# Patient Record
Sex: Male | Born: 1970 | Race: White | Hispanic: No | Marital: Married | Smoking: Former smoker
Health system: Southern US, Community
[De-identification: ages and names within clinical notes are randomized; demographics above are authoritative.]

## PROBLEM LIST (undated history)

## (undated) DIAGNOSIS — E119 Type 2 diabetes mellitus without complications: Secondary | ICD-10-CM

## (undated) DIAGNOSIS — K219 Gastro-esophageal reflux disease without esophagitis: Secondary | ICD-10-CM

## (undated) DIAGNOSIS — G4730 Sleep apnea, unspecified: Secondary | ICD-10-CM

## (undated) DIAGNOSIS — I10 Essential (primary) hypertension: Secondary | ICD-10-CM

## (undated) DIAGNOSIS — K222 Esophageal obstruction: Secondary | ICD-10-CM

## (undated) DIAGNOSIS — Z889 Allergy status to unspecified drugs, medicaments and biological substances status: Secondary | ICD-10-CM

## (undated) DIAGNOSIS — E669 Obesity, unspecified: Secondary | ICD-10-CM

## (undated) DIAGNOSIS — E7800 Pure hypercholesterolemia, unspecified: Secondary | ICD-10-CM

## (undated) DIAGNOSIS — R079 Chest pain, unspecified: Secondary | ICD-10-CM

## (undated) DIAGNOSIS — R002 Palpitations: Secondary | ICD-10-CM

## (undated) HISTORY — PX: ROTATOR CUFF REPAIR: SHX139

## (undated) HISTORY — DX: Type 2 diabetes mellitus without complications: E11.9

---

## 2003-08-06 ENCOUNTER — Emergency Department (HOSPITAL_COMMUNITY): Admission: EM | Admit: 2003-08-06 | Discharge: 2003-08-06 | Payer: Self-pay | Admitting: Emergency Medicine

## 2010-10-31 ENCOUNTER — Emergency Department (HOSPITAL_COMMUNITY): Payer: BC Managed Care – PPO

## 2010-10-31 ENCOUNTER — Emergency Department (HOSPITAL_COMMUNITY)
Admission: EM | Admit: 2010-10-31 | Discharge: 2010-10-31 | Disposition: A | Discharge status: Home / Self Care | Payer: BC Managed Care – PPO | Attending: Emergency Medicine | Admitting: Emergency Medicine

## 2010-10-31 DIAGNOSIS — I10 Essential (primary) hypertension: Secondary | ICD-10-CM | POA: Insufficient documentation

## 2010-10-31 DIAGNOSIS — I498 Other specified cardiac arrhythmias: Secondary | ICD-10-CM | POA: Insufficient documentation

## 2010-10-31 DIAGNOSIS — R072 Precordial pain: Secondary | ICD-10-CM | POA: Insufficient documentation

## 2010-10-31 LAB — DIFFERENTIAL
Basophils Absolute: 0 10*3/uL (ref 0.0–0.1)
Basophils Relative: 0 % (ref 0–1)
Eosinophils Absolute: 0.2 10*3/uL (ref 0.0–0.7)
Eosinophils Relative: 2 % (ref 0–5)
Lymphocytes Relative: 32 % (ref 12–46)
Lymphs Abs: 2.9 10*3/uL (ref 0.7–4.0)
Monocytes Absolute: 0.6 10*3/uL (ref 0.1–1.0)
Monocytes Relative: 7 % (ref 3–12)
Neutro Abs: 5.1 10*3/uL (ref 1.7–7.7)
Neutrophils Relative %: 58 % (ref 43–77)

## 2010-10-31 LAB — CBC
HCT: 44 % (ref 39.0–52.0)
Hemoglobin: 15.2 g/dL (ref 13.0–17.0)
MCH: 30.3 pg (ref 26.0–34.0)
MCHC: 34.5 g/dL (ref 30.0–36.0)
MCV: 87.6 fL (ref 78.0–100.0)
Platelets: 221 10*3/uL (ref 150–400)
RBC: 5.02 MIL/uL (ref 4.22–5.81)
RDW: 12.6 % (ref 11.5–15.5)
WBC: 8.8 10*3/uL (ref 4.0–10.5)

## 2010-10-31 LAB — POCT I-STAT, CHEM 8
BUN: 14 mg/dL (ref 6–23)
Calcium, Ion: 0.98 mmol/L — ABNORMAL LOW (ref 1.12–1.32)
Chloride: 103 mEq/L (ref 96–112)
Creatinine, Ser: 1.4 mg/dL (ref 0.4–1.5)
Glucose, Bld: 103 mg/dL — ABNORMAL HIGH (ref 70–99)
HCT: 47 % (ref 39.0–52.0)
Hemoglobin: 16 g/dL (ref 13.0–17.0)
Potassium: 3.7 mEq/L (ref 3.5–5.1)
Sodium: 135 mEq/L (ref 135–145)
TCO2: 22 mmol/L (ref 0–100)

## 2010-10-31 LAB — POCT CARDIAC MARKERS
CKMB, poc: <1 ng/mL — ABNORMAL LOW (ref 1.0–8.0)
Myoglobin, poc: 80.3 ng/mL (ref 12–200)
Troponin i, poc: <0.05 ng/mL (ref 0.00–0.09)

## 2010-10-31 LAB — TSH: TSH: 2.671 u[IU]/mL (ref 0.350–4.500)

## 2010-10-31 LAB — D-DIMER, QUANTITATIVE: D-Dimer, Quant: <0.22 ug/mL-FEU (ref 0.00–0.48)

## 2013-02-14 ENCOUNTER — Emergency Department (HOSPITAL_COMMUNITY): Payer: BC Managed Care – PPO

## 2013-02-14 ENCOUNTER — Observation Stay (HOSPITAL_COMMUNITY)
Admission: EM | Admit: 2013-02-14 | Discharge: 2013-02-16 | Disposition: A | Discharge status: Home / Self Care | Payer: BC Managed Care – PPO | Attending: Internal Medicine | Admitting: Internal Medicine

## 2013-02-14 ENCOUNTER — Encounter (HOSPITAL_COMMUNITY): Payer: Self-pay | Admitting: Emergency Medicine

## 2013-02-14 DIAGNOSIS — K219 Gastro-esophageal reflux disease without esophagitis: Secondary | ICD-10-CM | POA: Diagnosis present

## 2013-02-14 DIAGNOSIS — E876 Hypokalemia: Secondary | ICD-10-CM | POA: Diagnosis present

## 2013-02-14 DIAGNOSIS — R072 Precordial pain: Principal | ICD-10-CM | POA: Insufficient documentation

## 2013-02-14 DIAGNOSIS — D751 Secondary polycythemia: Secondary | ICD-10-CM | POA: Diagnosis present

## 2013-02-14 DIAGNOSIS — E875 Hyperkalemia: Secondary | ICD-10-CM | POA: Insufficient documentation

## 2013-02-14 DIAGNOSIS — Q391 Atresia of esophagus with tracheo-esophageal fistula: Secondary | ICD-10-CM | POA: Insufficient documentation

## 2013-02-14 DIAGNOSIS — D45 Polycythemia vera: Secondary | ICD-10-CM | POA: Insufficient documentation

## 2013-02-14 DIAGNOSIS — I10 Essential (primary) hypertension: Secondary | ICD-10-CM | POA: Diagnosis present

## 2013-02-14 DIAGNOSIS — R0602 Shortness of breath: Secondary | ICD-10-CM | POA: Insufficient documentation

## 2013-02-14 DIAGNOSIS — T502X5A Adverse effect of carbonic-anhydrase inhibitors, benzothiadiazides and other diuretics, initial encounter: Secondary | ICD-10-CM | POA: Insufficient documentation

## 2013-02-14 DIAGNOSIS — K222 Esophageal obstruction: Secondary | ICD-10-CM

## 2013-02-14 DIAGNOSIS — R079 Chest pain, unspecified: Secondary | ICD-10-CM | POA: Insufficient documentation

## 2013-02-14 DIAGNOSIS — Z79899 Other long term (current) drug therapy: Secondary | ICD-10-CM | POA: Insufficient documentation

## 2013-02-14 DIAGNOSIS — E871 Hypo-osmolality and hyponatremia: Secondary | ICD-10-CM | POA: Insufficient documentation

## 2013-02-14 DIAGNOSIS — R0789 Other chest pain: Secondary | ICD-10-CM

## 2013-02-14 DIAGNOSIS — E7800 Pure hypercholesterolemia, unspecified: Secondary | ICD-10-CM | POA: Diagnosis present

## 2013-02-14 HISTORY — DX: Pure hypercholesterolemia, unspecified: E78.00

## 2013-02-14 HISTORY — DX: Esophageal obstruction: K22.2

## 2013-02-14 HISTORY — DX: Obesity, unspecified: E66.9

## 2013-02-14 HISTORY — DX: Palpitations: R00.2

## 2013-02-14 HISTORY — DX: Essential (primary) hypertension: I10

## 2013-02-14 HISTORY — DX: Chest pain, unspecified: R07.9

## 2013-02-14 HISTORY — DX: Gastro-esophageal reflux disease without esophagitis: K21.9

## 2013-02-14 LAB — CBC
HCT: 48.8 % (ref 39.0–52.0)
Hemoglobin: 17.6 g/dL — ABNORMAL HIGH (ref 13.0–17.0)
MCH: 31.7 pg (ref 26.0–34.0)
MCHC: 36.1 g/dL — ABNORMAL HIGH (ref 30.0–36.0)
MCV: 87.9 fL (ref 78.0–100.0)
Platelets: 246 10*3/uL (ref 150–400)
RBC: 5.55 MIL/uL (ref 4.22–5.81)
RDW: 12.6 % (ref 11.5–15.5)
WBC: 9.3 10*3/uL (ref 4.0–10.5)

## 2013-02-14 LAB — PRO B NATRIURETIC PEPTIDE: Pro B Natriuretic peptide (BNP): 35.1 pg/mL (ref 0–125)

## 2013-02-14 LAB — BASIC METABOLIC PANEL
BUN: 15 mg/dL (ref 6–23)
CO2: 24 mEq/L (ref 19–32)
Calcium: 9.7 mg/dL (ref 8.4–10.5)
Chloride: 93 mEq/L — ABNORMAL LOW (ref 96–112)
Creatinine, Ser: 1.2 mg/dL (ref 0.50–1.35)
GFR calc Af Amer: 85 mL/min — ABNORMAL LOW (ref >90)
GFR calc non Af Amer: 73 mL/min — ABNORMAL LOW (ref >90)
Glucose, Bld: 129 mg/dL — ABNORMAL HIGH (ref 70–99)
Potassium: 3.5 mEq/L (ref 3.5–5.1)
Sodium: 131 mEq/L — ABNORMAL LOW (ref 135–145)

## 2013-02-14 LAB — POCT I-STAT TROPONIN I: Troponin i, poc: 0.07 ng/mL (ref 0.00–0.08)

## 2013-02-14 LAB — D-DIMER, QUANTITATIVE: D-Dimer, Quant: <0.27 ug/mL-FEU (ref 0.00–0.48)

## 2013-02-14 MED ORDER — ACETAMINOPHEN 650 MG RE SUPP: 650.0000 mg | Freq: Four times a day (QID) | RECTAL | Status: DC | PRN

## 2013-02-14 MED ORDER — SODIUM CHLORIDE 0.9 % IJ SOLN
3.0000 mL | Freq: Two times a day (BID) | INTRAMUSCULAR | Status: DC
  Administered 2013-02-14: 3 mL via INTRAVENOUS

## 2013-02-14 MED ORDER — ACETAMINOPHEN 325 MG PO TABS
650.0000 mg | ORAL_TABLET | Freq: Four times a day (QID) | ORAL | Status: DC | PRN
  Administered 2013-02-14 – 2013-02-15 (×2): 650 mg via ORAL
  Filled 2013-02-14 (×2): qty 2

## 2013-02-14 MED ORDER — ASPIRIN 81 MG PO CHEW
CHEWABLE_TABLET | ORAL | Status: AC
  Administered 2013-02-14: 324 mg
  Filled 2013-02-14: qty 4

## 2013-02-14 MED ORDER — MORPHINE SULFATE 4 MG/ML IJ SOLN
4.0000 mg | Freq: Once | INTRAMUSCULAR | Status: AC
  Administered 2013-02-14: 4 mg via INTRAVENOUS
  Filled 2013-02-14: qty 1

## 2013-02-14 MED ORDER — SODIUM CHLORIDE 0.9 % IV SOLN
250.0000 mL | INTRAVENOUS | Status: DC | PRN
  Administered 2013-02-15: 10 mL/h via INTRAVENOUS

## 2013-02-14 MED ORDER — OXYCODONE HCL 5 MG PO TABS
5.0000 mg | ORAL_TABLET | ORAL | Status: DC | PRN
  Administered 2013-02-14 – 2013-02-15 (×4): 5 mg via ORAL
  Filled 2013-02-14 (×4): qty 1

## 2013-02-14 MED ORDER — ONDANSETRON HCL 4 MG PO TABS: 4.0000 mg | ORAL_TABLET | Freq: Four times a day (QID) | ORAL | Status: DC | PRN

## 2013-02-14 MED ORDER — ALUM & MAG HYDROXIDE-SIMETH 200-200-20 MG/5ML PO SUSP
30.0000 mL | Freq: Four times a day (QID) | ORAL | Status: DC | PRN
Start: 2013-02-14 — End: 2013-02-16
  Administered 2013-02-15: 23:00:00 30 mL via ORAL
  Filled 2013-02-14: qty 30

## 2013-02-14 MED ORDER — ONDANSETRON HCL 4 MG/2ML IJ SOLN
4.0000 mg | Freq: Once | INTRAMUSCULAR | Status: AC
  Administered 2013-02-14: 4 mg via INTRAVENOUS
  Filled 2013-02-14: qty 2

## 2013-02-14 MED ORDER — NITROGLYCERIN 2 % TD OINT
0.5000 [in_us] | TOPICAL_OINTMENT | Freq: Four times a day (QID) | TRANSDERMAL | Status: DC
  Filled 2013-02-14: qty 1

## 2013-02-14 MED ORDER — SODIUM CHLORIDE 0.9 % IJ SOLN: 3.0000 mL | INTRAMUSCULAR | Status: DC | PRN

## 2013-02-14 MED ORDER — ZOLPIDEM TARTRATE 5 MG PO TABS
5.0000 mg | ORAL_TABLET | Freq: Every evening | ORAL | Status: DC | PRN
  Administered 2013-02-14: 5 mg via ORAL
  Filled 2013-02-14: qty 1

## 2013-02-14 MED ORDER — ENOXAPARIN SODIUM 40 MG/0.4ML ~~LOC~~ SOLN
40.0000 mg | SUBCUTANEOUS | Status: DC
  Administered 2013-02-15: 40 mg via SUBCUTANEOUS
  Filled 2013-02-14 (×2): qty 0.4

## 2013-02-14 MED ORDER — ASPIRIN 81 MG PO CHEW: 324.0000 mg | CHEWABLE_TABLET | Freq: Once | ORAL | Status: DC

## 2013-02-14 MED ORDER — ASPIRIN 325 MG PO TABS
325.0000 mg | ORAL_TABLET | Freq: Every day | ORAL | Status: DC
  Administered 2013-02-15 – 2013-02-16 (×2): 325 mg via ORAL
  Filled 2013-02-14 (×2): qty 1

## 2013-02-14 MED ORDER — HYDROMORPHONE HCL PF 1 MG/ML IJ SOLN
0.5000 mg | INTRAMUSCULAR | Status: DC | PRN
  Administered 2013-02-15 (×5): 1 mg via INTRAVENOUS
  Filled 2013-02-14 (×5): qty 1

## 2013-02-14 MED ORDER — ONDANSETRON HCL 4 MG/2ML IJ SOLN: 4.0000 mg | Freq: Four times a day (QID) | INTRAMUSCULAR | Status: DC | PRN

## 2013-02-14 NOTE — ED Notes (Signed)
EDPA at [facility], pt / family updated.

## 2013-02-14 NOTE — ED Provider Notes (Signed)
Medical screening examination/treatment/procedure(s) were performed by non-physician practitioner and as supervising physician I was immediately available for consultation/collaboration.  Juliet Rude. Rubin Payor, MD 02/14/13 [phone]

## 2013-02-14 NOTE — ED Notes (Addendum)
Pt updated, family at [facility], "feel better, pain decreased", alert, NAD, calm, interactive, VSS. Denies sx other than chest pressure, 4-5/10.

## 2013-02-14 NOTE — H&P (Signed)
[facility] History and Physical  Colman Birdwell ZOX:[id number] DOB: 1970/11/04 DOA: 02/14/2013  Referring physician: EDP PCP: No primary provider on file.  Specialists:   Chief Complaint:  Chest Pain   HPI: Vincent Hicks is a 42 y.o. male with a history of HTN who presents to the ED with complaints of intermittent Chest Pain in the left chest radiating into his left arm associated with SOB,  Dizziness, nausea, vomiting and diaphoresis.   He describes the pain as chest tightness, and rated the pain as a 10/10 when it was at the worse.    He reports having chest pain off an on for several years but the pain for the last 3 days has been worse.     He was evlauated in the ED and his initial workup was negative  And he was referred for medical admission.   The EDP spoke to cards on call and Dr. Armanda Magic is to see the patient in the AM on 07/07.      Review of Systems: The patient denies anorexia, fever, chills, headaches, weight loss, vision loss, diplopia, dizziness, decreased hearing, rhinitis, hoarseness, chest pain, syncope, dyspnea on exertion, peripheral edema, balance deficits, cough, hemoptysis, abdominal pain, nausea, vomiting, diarrhea, constipation, hematemesis, melena, hematochezia, severe indigestion/heartburn, dysuria, hematuria, incontinence, muscle weakness, suspicious skin lesions, transient blindness, difficulty walking, depression, unusual weight change, abnormal bleeding, enlarged lymph nodes, angioedema, and breast masses.   Past Medical History  Diagnosis Date  . Hypertension   . Hypercholesteremia   . GERD (gastroesophageal reflux disease)     Past Surgical History  Procedure Laterality Date  . Rotator cuff repair      Prior to Admission medications   Medication Sig Start Date End Date Taking? Authorizing Provider  amLODipine (NORVASC) 5 MG tablet Take 5 mg by mouth daily.   Yes Historical Provider, MD  hydrochlorothiazide (HYDRODIURIL) 25 MG tablet Take 25 mg  by mouth daily.   Yes Historical Provider, MD  lisinopril (PRINIVIL,ZESTRIL) 40 MG tablet Take 40 mg by mouth daily.   Yes Historical Provider, MD  lovastatin (MEVACOR) 10 MG tablet Take 20 mg by mouth daily.    Yes Historical Provider, MD  omeprazole (PRILOSEC) 20 MG capsule Take 20 mg by mouth daily.   Yes Historical Provider, MD     No Known Allergies   Social History:      Married, He uses smokeless Tobacco   has no tobacco, alcohol, and drug history on file.     Family History:        COPD in Mother  HTN in Father    Physical Exam:  GEN:  Pleasant Well nourished and well developed  42 y.o. Caucasian male  examined  and in no acute distress; cooperative with exam Filed Vitals:   02/14/13 2145 02/14/13 2200 02/14/13 2215 02/14/13 2230  BP: 109/68 103/71 113/72 105/71  Pulse: 73 73 69 71  Temp:      TempSrc:      Resp: 18 22 14 13   Height:      Weight:      SpO2: 96% 94% 98% 99%   Blood pressure 105/71, pulse 71, temperature 98.4 F (36.9 C), temperature source Oral, resp. rate 13, height 5\' 11"  (1.803 m), weight 127.007 kg (280 lb), SpO2 99.00%. PSYCH: He is alert and oriented x4; does not appear anxious does not appear depressed; affect is normal HEENT: Normocephalic and Atraumatic, Mucous membranes pink; PERRLA; EOM intact; Fundi:  Benign;   +Scleral and  conjunctival erythema,  No scleral icterus, Nares: Patent, Oropharynx: Clear, Fair Dentition, Neck:  FROM, no cervical lymphadenopathy nor thyromegaly or carotid bruit; no JVD; Breasts:: Not examined CHEST WALL: No tenderness CHEST: Normal respiration, clear to auscultation bilaterally HEART: Regular rate and rhythm; no murmurs rubs or gallops BACK: No kyphosis or scoliosis; no CVA tenderness ABDOMEN: Positive Bowel Sounds, Obese, soft non-tender; no masses, no organomegaly.    Rectal Exam: Not done EXTREMITIES: No cyanosis, clubbing or edema; no ulcerations. Genitalia: not examined PULSES: 2+ and symmetric SKIN:  Normal hydration no rash or ulceration CNS: Cranial nerves 2-12 grossly intact, No Motor or Sensory deficits, and no focal neurologic deficit    Labs on Admission:  Basic Metabolic Panel:  Recent Labs Lab 02/14/13 1828  NA 131*  K 3.5  CL 93*  CO2 24  GLUCOSE 129*  BUN 15  CREATININE 1.20  CALCIUM 9.7   Liver Function Tests: No results found for this basename: AST, ALT, ALKPHOS, BILITOT, PROT, ALBUMIN,  in the last 168 hours No results found for this basename: LIPASE, AMYLASE,  in the last 168 hours No results found for this basename: AMMONIA,  in the last 168 hours CBC:  Recent Labs Lab 02/14/13 2123  WBC 9.3  HGB 17.6*  HCT 48.8  MCV 87.9  PLT 246   Cardiac Enzymes: No results found for this basename: CKTOTAL, CKMB, CKMBINDEX, TROPONINI,  in the last 168 hours  BNP (last 3 results)  Recent Labs  02/14/13 1828  PROBNP 35.1   CBG: No results found for this basename: GLUCAP,  in the last 168 hours  Radiological Exams on Admission: Dg Chest 2 View (if Patient Has Fever And/or Copd)  02/14/2013   *RADIOLOGY REPORT*  Clinical Data: Left-sided chest pain.  CHEST - 2 VIEW  Comparison: None.  Findings: Heart and mediastinal contours are within normal limits. No focal opacities or effusions.  No acute bony abnormality.  Mild peribronchial thickening.  IMPRESSION: Mild bronchitic changes.   Original Report Authenticated By: Charlett Nose, M.D.     EKG: Independently reviewed. Sinus Tachycardia rate =108, No acute S-T changes   Assessment/Plan Principal Problem:   Chest pain Active Problems:   Hypertension   Hypercholesteremia   GERD (gastroesophageal reflux disease)    1.  Chest Pain-  CArdiac Monitoring, cycle troponins, nitropaste, O2, ASA, and Betablocker Therapy.  Cards to see in AM (Dr. Armanda Magic).    2.  Hypertension-  Currently Normotensive, monitor.     3.  Hyperlipidemia-  Continue Mevacor,  Check fasting lipids in AM 07/07.    4.  GERD-  Add  Protonix            Code Status:    FULL CODE  Family Communication:    Wife at Bedside Disposition Plan:   Return to Home on discharge    Time spent:  78 Minutes  Ron Parker [facility] Pager [phone]  If 7PM-7AM, please contact night-coverage [email or web address] Password [email or web address] 02/14/2013, 11:25 PM

## 2013-02-14 NOTE — ED Provider Notes (Signed)
History    CSN: [id number] Arrival date & time 02/14/13  1816  First MD Initiated Contact with Patient 02/14/13 2047     Chief Complaint  Patient presents with  . Chest Pain   (Consider location/radiation/quality/duration/timing/severity/associated sxs/prior Treatment) HPI Comments: 42 y/o male with a PMHx of HTN, hypercholesterolemia and GERD presents to the ED complaining of left sided chest pain beginning two days ago. Pain intermittent, described as heavy, radiating to his left shoulder rated 6/10. Nothing in specific makes the pain come or go. Episode lengths vary. Admits to associated shortness of breath as if he has to catch his breath. Today he became lightheaded, dizzy, nauseated. Denies personal or family history of heart disease or blood clots. No recent long car rides or air travel. Non-smoker. Denies cough, fever, chills, vomiting.  Patient is a 42 y.o. male presenting with chest pain. The history is provided by the patient and the spouse.  Chest Pain Associated symptoms: diaphoresis, dizziness, nausea and shortness of breath   Associated symptoms: no back pain, no cough, no fever and not vomiting    Past Medical History  Diagnosis Date  . Hypertension   . Hypercholesteremia   . GERD (gastroesophageal reflux disease)    Past Surgical History  Procedure Laterality Date  . Rotator cuff repair     No family history on file. History  Substance Use Topics  . Smoking status: Not on file  . Smokeless tobacco: Not on file  . Alcohol Use: Not on file    Review of Systems  Constitutional: Positive for diaphoresis. Negative for fever and chills.  Respiratory: Positive for shortness of breath. Negative for cough.   Cardiovascular: Positive for chest pain.  Gastrointestinal: Positive for nausea. Negative for vomiting.  Musculoskeletal: Negative for back pain.  Neurological: Positive for dizziness and light-headedness.  Psychiatric/Behavioral: Negative for confusion.  All  other systems reviewed and are negative.    Allergies  Review of patient's allergies indicates no known allergies.  Home Medications   Current Outpatient Rx  Name  Route  Sig  Dispense  Refill  . amLODipine (NORVASC) 5 MG tablet   Oral   Take 5 mg by mouth daily.         . hydrochlorothiazide (HYDRODIURIL) 25 MG tablet   Oral   Take 25 mg by mouth daily.         Marland Kitchen lisinopril (PRINIVIL,ZESTRIL) 40 MG tablet   Oral   Take 40 mg by mouth daily.         Marland Kitchen lovastatin (MEVACOR) 10 MG tablet   Oral   Take 20 mg by mouth at bedtime.         Marland Kitchen omeprazole (PRILOSEC) 20 MG capsule   Oral   Take 20 mg by mouth daily.          BP 108/73  Pulse 107  Temp(Src) 98.4 F (36.9 C) (Oral)  Resp 18  Ht 5\' 11"  (1.803 m)  Wt 280 lb (127.007 kg)  BMI 39.07 kg/m2  SpO2 95% Physical Exam  Nursing note and vitals reviewed. Constitutional: He is oriented to person, place, and time. He appears well-developed. No distress.  Overweight  HENT:  Head: Normocephalic and atraumatic.  Mouth/Throat: Oropharynx is clear and moist.  Eyes: EOM are normal. Pupils are equal, round, and reactive to light. Right conjunctiva is injected. Left conjunctiva is injected.  Neck: Normal range of motion. Neck supple.  Cardiovascular: Normal rate, regular rhythm, normal heart sounds and intact distal pulses.  No extremity edema.  Pulmonary/Chest: Effort normal and breath sounds normal. No respiratory distress. He has no decreased breath sounds. He has no wheezes. He has no rhonchi. He has no rales. He exhibits no tenderness.  Abdominal: Soft. Bowel sounds are normal. There is no tenderness.  Musculoskeletal: Normal range of motion. He exhibits no edema.  Neurological: He is alert and oriented to person, place, and time. He has normal strength.  Skin: Skin is warm and dry. He is not diaphoretic.  Psychiatric: He has a normal mood and affect. His behavior is normal.    ED Course  Procedures  (including critical care time) Labs Reviewed  BASIC METABOLIC PANEL - Abnormal; Notable for the following:    Sodium 131 (*)    Chloride 93 (*)    Glucose, Bld 129 (*)    GFR calc non Af Amer 73 (*)    GFR calc Af Amer 85 (*)    All other components within normal limits  CBC - Abnormal; Notable for the following:    Hemoglobin 17.6 (*)    MCHC 36.1 (*)    All other components within normal limits  PRO B NATRIURETIC PEPTIDE  D-DIMER, QUANTITATIVE  POCT I-STAT TROPONIN I    Date: 02/14/2013  Rate: 108  Rhythm: sinus tachycardia  QRS Axis: right  Intervals: normal  ST/T Wave abnormalities: normal  Conduction Disutrbances:none  Narrative Interpretation: sinus tachycardia, RAD, no stemi  Old EKG Reviewed: none available   Dg Chest 2 View (if Patient Has Fever And/or Copd)  02/14/2013   *RADIOLOGY REPORT*  Clinical Data: Left-sided chest pain.  CHEST - 2 VIEW  Comparison: None.  Findings: Heart and mediastinal contours are within normal limits. No focal opacities or effusions.  No acute bony abnormality.  Mild peribronchial thickening.  IMPRESSION: Mild bronchitic changes.   Original Report Authenticated By: Charlett Nose, M.D.   1. Chest pain     MDM  Patient with 2 days of intermittent chest pain, worsening today, associated nausea, diaphoresis, sob. Pain not reproducable. EKG with sinus tachycardia, RAD. CXR showing bronchitic changes. Troponin negative. Labs obtained in triage prior to patient being seen. Will add d-dimer as patient is not PERC negative- tachycardic. 10:11 PM D-dimer negative. Due to hx and nature of symptoms, I feel patient should be admitted into the hospital for further cardiac workup. Case discussed with Dr. Rubin Payor who agrees with plan of care. Will consult cardiology. 11:04 PM I spoke with Dr. Mayford Knife about patient who requested admission to hospitalist and will have cardiology consult in the morning. Admission accepted by Dr. Lovell Sheehan, [facility] team 10,  observation status.  Trevor Mace, PA-C 02/14/13 2305

## 2013-02-14 NOTE — ED Notes (Signed)
Pt c/o left side cp that radaites to left arm with sob, diaphoresis, nausea and light headed. Pt presents with redness to B/l eyes, reports that he has an eye allergy that he takes drops for.

## 2013-02-15 ENCOUNTER — Encounter (HOSPITAL_COMMUNITY): Payer: Self-pay | Admitting: Radiology

## 2013-02-15 ENCOUNTER — Encounter (HOSPITAL_COMMUNITY)
Admission: EM | Disposition: A | Discharge status: Home / Self Care | Payer: Self-pay | Source: Home / Self Care | Attending: Emergency Medicine

## 2013-02-15 ENCOUNTER — Observation Stay (HOSPITAL_COMMUNITY): Payer: BC Managed Care – PPO

## 2013-02-15 DIAGNOSIS — R079 Chest pain, unspecified: Secondary | ICD-10-CM

## 2013-02-15 DIAGNOSIS — E876 Hypokalemia: Secondary | ICD-10-CM | POA: Diagnosis present

## 2013-02-15 DIAGNOSIS — R0789 Other chest pain: Secondary | ICD-10-CM | POA: Insufficient documentation

## 2013-02-15 DIAGNOSIS — E871 Hypo-osmolality and hyponatremia: Secondary | ICD-10-CM | POA: Diagnosis present

## 2013-02-15 DIAGNOSIS — R072 Precordial pain: Secondary | ICD-10-CM

## 2013-02-15 DIAGNOSIS — D751 Secondary polycythemia: Secondary | ICD-10-CM | POA: Diagnosis present

## 2013-02-15 DIAGNOSIS — K222 Esophageal obstruction: Secondary | ICD-10-CM

## 2013-02-15 DIAGNOSIS — D45 Polycythemia vera: Secondary | ICD-10-CM

## 2013-02-15 HISTORY — PX: LEFT HEART CATHETERIZATION WITH CORONARY ANGIOGRAM: SHX5451

## 2013-02-15 LAB — BASIC METABOLIC PANEL
BUN: 15 mg/dL (ref 6–23)
Chloride: 93 mEq/L — ABNORMAL LOW (ref 96–112)
GFR calc non Af Amer: 79 mL/min — ABNORMAL LOW (ref >90)
Glucose, Bld: 103 mg/dL — ABNORMAL HIGH (ref 70–99)
Potassium: 3 mEq/L — ABNORMAL LOW (ref 3.5–5.1)

## 2013-02-15 LAB — PROTIME-INR
INR: 1.05 (ref 0.00–1.49)
Prothrombin Time: 13.5 seconds (ref 11.6–15.2)

## 2013-02-15 LAB — CBC
HCT: 45.7 % (ref 39.0–52.0)
Hemoglobin: 16 g/dL (ref 13.0–17.0)
MCH: 30.9 pg (ref 26.0–34.0)
MCHC: 35 g/dL (ref 30.0–36.0)

## 2013-02-15 LAB — POTASSIUM: Potassium: 3.7 mEq/L (ref 3.5–5.1)

## 2013-02-15 SURGERY — LEFT HEART CATHETERIZATION WITH CORONARY ANGIOGRAM
Anesthesia: LOCAL

## 2013-02-15 MED ORDER — ASPIRIN 81 MG PO CHEW: 324.0000 mg | CHEWABLE_TABLET | ORAL | Status: DC

## 2013-02-15 MED ORDER — HYDROMORPHONE HCL PF 1 MG/ML IJ SOLN: 1.0000 mg | INTRAMUSCULAR | Status: DC | PRN

## 2013-02-15 MED ORDER — PANTOPRAZOLE SODIUM 40 MG PO TBEC
40.0000 mg | DELAYED_RELEASE_TABLET | Freq: Every day | ORAL | Status: DC
  Administered 2013-02-15: 40 mg via ORAL
  Filled 2013-02-15: qty 1

## 2013-02-15 MED ORDER — IOHEXOL 350 MG/ML SOLN
100.0000 mL | Freq: Once | INTRAVENOUS | Status: AC | PRN
  Administered 2013-02-15: 100 mL via INTRAVENOUS

## 2013-02-15 MED ORDER — SODIUM CHLORIDE 0.9 % IV BOLUS (SEPSIS)
750.0000 mL | Freq: Once | INTRAVENOUS | Status: AC
  Administered 2013-02-15: 750 mL via INTRAVENOUS

## 2013-02-15 MED ORDER — SIMVASTATIN 10 MG PO TABS
10.0000 mg | ORAL_TABLET | Freq: Every day | ORAL | Status: DC
  Filled 2013-02-15 (×2): qty 1

## 2013-02-15 MED ORDER — FENTANYL CITRATE 0.05 MG/ML IJ SOLN
INTRAMUSCULAR | Status: AC
  Filled 2013-02-15: qty 2

## 2013-02-15 MED ORDER — ONDANSETRON HCL 4 MG/2ML IJ SOLN: 4.0000 mg | Freq: Four times a day (QID) | INTRAMUSCULAR | Status: DC | PRN

## 2013-02-15 MED ORDER — ONDANSETRON HCL 4 MG/2ML IJ SOLN: 4.0000 mg | Freq: Three times a day (TID) | INTRAMUSCULAR | Status: DC | PRN

## 2013-02-15 MED ORDER — NITROGLYCERIN 2 % TD OINT
0.5000 [in_us] | TOPICAL_OINTMENT | Freq: Four times a day (QID) | TRANSDERMAL | Status: DC
  Administered 2013-02-15 – 2013-02-16 (×4): 0.5 [in_us] via TOPICAL
  Filled 2013-02-15 (×2): qty 30

## 2013-02-15 MED ORDER — SODIUM CHLORIDE 0.9 % IV SOLN
1.0000 mL/kg/h | INTRAVENOUS | Status: DC
  Administered 2013-02-15: 1 mL/kg/h via INTRAVENOUS

## 2013-02-15 MED ORDER — POTASSIUM CHLORIDE CRYS ER 20 MEQ PO TBCR
40.0000 meq | EXTENDED_RELEASE_TABLET | Freq: Once | ORAL | Status: AC
  Administered 2013-02-15: 40 meq via ORAL
  Filled 2013-02-15: qty 2

## 2013-02-15 MED ORDER — SODIUM CHLORIDE 0.9 % IV SOLN: INTRAVENOUS | Status: DC

## 2013-02-15 MED ORDER — ACETAMINOPHEN 325 MG PO TABS: 650.0000 mg | ORAL_TABLET | ORAL | Status: DC | PRN

## 2013-02-15 MED ORDER — ASPIRIN 81 MG PO CHEW: 324.0000 mg | CHEWABLE_TABLET | ORAL | Status: AC

## 2013-02-15 MED ORDER — MIDAZOLAM HCL 2 MG/2ML IJ SOLN
INTRAMUSCULAR | Status: AC
  Filled 2013-02-15: qty 2

## 2013-02-15 NOTE — Interval H&P Note (Signed)
History and Physical Interval Note:  02/15/2013 5:02 PM  Vincent Hicks  has presented today for surgery, with the diagnosis of Chest pain  The various methods of treatment have been discussed with the patient and family. After consideration of risks, benefits and other options for treatment, the patient has consented to  Procedure(s): LEFT HEART CATHETERIZATION WITH CORONARY ANGIOGRAM (N/A) as a surgical intervention .  The patient's history has been reviewed, patient examined, no change in status, stable for surgery.  I have reviewed the patient's chart and labs.  Questions were answered to the patient's satisfaction.     Elyn Aquas.

## 2013-02-15 NOTE — CV Procedure (Signed)
    Cardiac Cath Note  Ahren Pettinger [id number] 05-30-1971  Procedure: left  Heart Cardiac Catheterization Note Indications: chest discomfort  Procedure Details Consent: Obtained Time Out: Verified patient identification, verified procedure, site/side was marked, verified correct patient position, special equipment/implants available, Radiology Safety Procedures followed,  medications/allergies/relevent history reviewed, required imaging and test results available.  Performed   Medications: Fentanyl: 50 mcg IV Versed: 2 mg IV Verapamil 3 mg IA Heparin 6500 units  The right radial  artery was easily canulated using a modified Seldinger technique.  Hemodynamics:   LV pressure: 105/19 Aortic pressure: 104/73  Angiography   Left Main: large and normal   Left anterior Descending: large, smooth and normal,  Several diags are smooth and normal  Left Circumflex: large and normal.  The 1st OM is small but is normal.  The 2nd OM is smooth and normal..  The PLSA vessels are normal  Right Coronary Artery: very large vessel, smooth , normal, dominant.  The PDA and PLSA are normal  LV Gram: normal LV function.  EF 65-70%.  Complications: No apparent complications Patient did tolerate procedure well.  Contrast used: 70 cc  Conclusions:   1. Smooth and normal coronaries.  2. Normal LV function.   Vesta Mixer, Montez Hageman., MD, [facility] 02/15/2013, 5:48 PM Office - [phone] Pager [phone]

## 2013-02-15 NOTE — Progress Notes (Signed)
[facility] Progress Note [place] TEAM 1 - Stepdown/ICU LARSON LIMONES Juran ZOX:[id number] DOB: 14-Jun-1971 DOA: 02/14/2013 PCP: No primary provider on file.  Brief narrative: 42 y.o. male with a history of HTN who presented to the ED with complaints of intermittent Chest Pain of the left chest - radiating into his left arm and associated with SOB, dizziness, nausea, vomiting and diaphoresis. He described the pain as chest tightness, and rated the pain as a 10/10 when it was at the worse. He reported having chest pain off an on for several years but the "pain" for the last 3 days has been worse. He was evlauated in the ED and his initial workup was negative and he was referred for medical admission.    Assessment/Plan:    Left chest pressure -EKG and enzymes have been negative thus far -today he desribes pain as "pressure"- waxing and waning since admit -Cards following and plan cath today based on pt preference for definitive testing    Hypertension -home meds on hold and since ER has had soft BP    GERD (gastroesophageal reflux disease) / History of Schatzki's ring -10 years ago had definitive testing (ambulatroy pH monitoring) to confirm severe reflux  -has undergone dilatation of Schatzki's ring x 2 in past -cont PPI -states did not have classic reflux sx's at home so stopped PPI -if cardiac w/u is negative, oupt GI eval would be next appropriate step    Hyponatremia/ Hypokalemia -due to thiazide diuretics pre admit - recheck in AM - holding diuretic    Hypercholesteremia -on Mevacor pre admit    Polycythemia -baseline Hct unknown- ?? Dehydrated - recheck in AM   DVT prophylaxis: Lovenox Code Status: Full Family Communication: Patient and wife at bedside Disposition Plan: Transfer to Telemetry   Consultants: Cardiology  Procedures: Cardiac cath - pending  Antibiotics: None  HPI/Subjective: Continues to endorse waxing and waning chest pressure that is "a  little better" after pain medicines.   Objective: Blood pressure 113/70, pulse 69, temperature 97.6 F (36.4 C), temperature source Oral, resp. rate 16, height 5\' 11"  (1.803 m), weight 127.007 kg (280 lb), SpO2 98.00%.  Intake/Output Summary (Last 24 hours) at 02/15/13 1241 Last data filed at 02/15/13 1200  Gross per 24 hour  Intake  220.5 ml  Output    150 ml  Net   70.5 ml    Exam: General: No acute respiratory distress Lungs: Clear to auscultation bilaterally without wheezes or crackles, RA Cardiovascular: Regular rate and rhythm without murmur gallop or rub normal S1 and S2, no peripheral edema or JVD Abdomen: Nontender, nondistended, soft, bowel sounds positive, no rebound, no ascites, no appreciable mass Musculoskeletal: No significant cyanosis, clubbing of bilateral lower extremities Neurological: Alert and oriented x 3, moves all extremities x 4 without focal neurological deficits, CN 2-12 intact  Scheduled Meds: Scheduled Meds: . [START ON 02/16/2013] aspirin  324 mg Oral Pre-Cath  . aspirin  325 mg Oral Daily  . enoxaparin (LOVENOX) injection  40 mg Subcutaneous Q24H  . nitroGLYCERIN  0.5 inch Topical Q6H  . pantoprazole  40 mg Oral Daily  . simvastatin  10 mg Oral q1800  . sodium chloride  3 mL Intravenous Q12H    Data Reviewed: Basic Metabolic Panel:  Recent Labs Lab 02/14/13 1828 02/15/13 0355  NA 131* 133*  K 3.5 3.0*  CL 93* 93*  CO2 24 27  GLUCOSE 129* 103*  BUN 15 15  CREATININE 1.20 1.13  CALCIUM 9.7  8.5   CBC:  Recent Labs Lab 02/14/13 2123 02/15/13 0355  WBC 9.3 8.4  HGB 17.6* 16.0  HCT 48.8 45.7  MCV 87.9 88.2  PLT 246 232   Cardiac Enzymes:  Recent Labs Lab 02/14/13 2308 02/15/13 0355 02/15/13 1112  TROPONINI <0.30 <0.30 <0.30   BNP (last 3 results)  Recent Labs  02/14/13 1828  PROBNP 35.1    Recent Results (from the past 240 hour(s))  MRSA PCR SCREENING     Status: None   Collection Time    02/15/13  1:58 AM       Result Value Range Status   MRSA by PCR NEGATIVE  NEGATIVE Final   Comment:            The GeneXpert MRSA Assay (FDA     approved for NASAL specimens     only), is one component of a     comprehensive MRSA colonization     surveillance program. It is not     intended to diagnose MRSA     infection nor to guide or     monitor treatment for     MRSA infections.     Studies:  Recent x-ray studies have been reviewed in detail by the Attending Physician    Junious Silk, ANP [facility] Office  [phone] Pager [phone]  **If unable to reach the above provider after paging please contact the Flow Manager @ [phone]  On-Call/Text Page:      [email or web address]      password [email or web address]  If 7PM-7AM, please contact night-coverage [email or web address] Password [email or web address] 02/15/2013, 12:41 PM   LOS: 1 day   I have personally examined this patient and reviewed the entire database. I have reviewed the above note, made any necessary editorial changes, and agree with its content.  Lonia Blood, MD [facility]

## 2013-02-15 NOTE — ED Notes (Signed)
No changes, pt to CT.  

## 2013-02-15 NOTE — ED Notes (Signed)
Report called to 6700 primary receiving RN, pt to go to CT for PE study prior to going to floor, no changes. VSS.

## 2013-02-15 NOTE — Progress Notes (Signed)
Potassium 3.0.  Calling to notify on call for eagle.  Called answering service

## 2013-02-15 NOTE — ED Notes (Addendum)
BP low, still having pain, Dr. Lovell Sheehan aware, pt upgraded to SD, flow manager aware. Pt updated.  No changes, pt alert, NAD, calm, VSS, wife at [facility].

## 2013-02-15 NOTE — Consult Note (Signed)
 CARDIOLOGY CONSULT NOTE  Patient ID: Vincent Hicks MRN: [id number], DOB/AGE: 04/23/1971   Admit date: 02/14/2013 Date of Consult: 02/15/2013  Primary Physician: No primary provider on file. Primary Cardiologist: new - seen by D. Nguyet Mercer, MD   Pt. Profile  41 y/o male with h/o chest pain who presented to the ED yesterday 2/2 worsening Ss with dizziness, n, v.  Problem List  Past Medical History  Diagnosis Date  . Hypertension   . Hypercholesteremia   . GERD (gastroesophageal reflux disease)   . Schatzki's ring     a. s/p dilatations in the past.  . Chest pain   . Palpitations     a. seen by cardiology in [place] and apparently wore a monitor - unrevealing (2012).  . Obesity     Past Surgical History  Procedure Laterality Date  . Rotator cuff repair      Allergies  No Known Allergies  HPI   Vincent Hicks is a 41 y/o M with a h/o HTN and hypercholesterolemia who has experienced intermittent chest pain for 2 years which has worsened, becoming more frequent and accompanied by increased amount of s/s.  He has a h/o Schatzki's Ring s/p multiple dilatations in the past, though he hasn't seen GI in some time.  His Ss r/t to this were predominantly dysphagia, which he has not had any recurrence of since his last dilatation.  He is on a PPI @ home.  For the past 2 years, he has been experiencing intermittent chest pressure with occasional radiating pain to the left shoulder/arm occuring on average 1x/week and lasting for 2-3 days at a time generally w/o associated Ss. With each episode the pressure is constant but the pain fluctuates, reaching 9.5/10 severity at its worst. Two years ago at onset of these symptoms, he was seen in the ED here and then referred to cardiology.  At the time, he was living in [place] and saw cardiology out there.  He wore a Holter monitor for 24 hours, which was apparently unrevealing but did not undergo stress testing of any kind.   Starting 7/2, he  developed recurrent chest pressure that has been constant.  Beginning on 7/4, he noted dizziness (room spinning), which was initially mild but worsened significantly on Saturday and later became associated with nausea and vomiting (5 episodes Saturdayy night).  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  He felt somewhat better on Sunday morning, though chest pain persisted, but later in the day, dizziness recurred.  His wife convinced him to come into the ED yesterday evening and did have some relief with morphine and zofran and then more relief with dilaudid.   Chest pain persists this AM @ lower level.  It is not worse with deep breathing, palpation, position changes, or coughing.  ECG is non-acute and initial troponins are negative x 2.  Inpatient Medications  . aspirin  325 mg Oral Daily  . enoxaparin (LOVENOX) injection  40 mg Subcutaneous Q24H  . nitroGLYCERIN  0.5 inch Topical Q6H  . pantoprazole  40 mg Oral Daily  . simvastatin  10 mg Oral q1800  . sodium chloride  3 mL Intravenous Q12H   Family History Family History  Problem Relation Age of Onset  . COPD Mother     Social History History   Social History  . Marital Status: Legally Separated    Spouse Name: N/A    Number of Children: N/A  . Years of Education: N/A     Occupational History  . Not on file.   Social History Main Topics  . Smoking status: Never Smoker   . Smokeless tobacco: Not on file  . Alcohol Use: Yes     Comment: "social drinking"  . Drug Use: No  . Sexually Active: Not on file   Other Topics Concern  . Not on file   Social History Narrative   Lives in [place] with his wife.  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  Often works until 2AM.  Does not routinely exercise.    Review of Systems  General:  No chills, fever, night sweats or weight changes.  Cardiovascular:  +++ chest pain, No edema, orthopnea.  Respiratory: No cough. + Intermittent SOB. Abdominal:   No nausea,  vomiting since Saturday. Neurologic:  + dizziness.  No visual changes, wkns, changes in mental status. All other systems reviewed and are otherwise negative except as noted above.  Physical Exam  Blood pressure 125/78, pulse 71, temperature 97.8 F (36.6 C), temperature source Oral, resp. rate 16, height 5' 11" (1.803 m), weight 280 lb (127.007 kg), SpO2 96.00%.  General: Pleasant, NAD Psych: Normal affect. Neuro: Alert and oriented X 3. Moves all extremities spontaneously. HEENT: Normal  Neck: Supple without bruits or JVD. Lungs:  Resp regular and unlabored, CTAB. Heart: RRR no s3, s4, or murmurs. Abdomen: Soft, non-tender, non-distended, BS + x 4.  Extremities: No clubbing, cyanosis or edema. DP/PT/Radials 2+ and equal bilaterally.  Labs   Recent Labs  02/14/13 2308 02/15/13 0355  TROPONINI <0.30 <0.30   Lab Results  Component Value Date   WBC 8.4 02/15/2013   HGB 16.0 02/15/2013   HCT 45.7 02/15/2013   MCV 88.2 02/15/2013   PLT 232 02/15/2013     Recent Labs Lab 02/15/13 0355  NA 133*  K 3.0*  CL 93*  CO2 27  BUN 15  CREATININE 1.13  CALCIUM 8.5  GLUCOSE 103*   Lab Results  Component Value Date   DDIMER <0.27 02/14/2013   Radiology/Studies  Dg Chest 2 View (if Patient Has Fever And/or Copd)  02/14/2013   *RADIOLOGY REPORT*  Clinical Data: Left-sided chest pain.  CHEST - 2 VIEW  Comparison: None.  Findings: Heart and mediastinal contours are within normal limits. No focal opacities or effusions.  No acute bony abnormality.  Mild peribronchial thickening.  IMPRESSION: Mild bronchitic changes.   Original Report Authenticated By: Kevin Dover, M.D.   Ct Angio Chest Aortic Dissect W &/or W/o  02/15/2013   *RADIOLOGY REPORT*  Clinical Data: Intermittent chest pain on the left radiating to the left arm associated with shortness of breath, dizziness, vomiting, nausea, and diaphoresis.  CT ANGIOGRAPHY CHEST    IMPRESSION: No evidence of aortic dissection or pulmonary embolus.  No  evidence of active pulmonary disease.   Original Report Authenticated By: William Stevens, M.D.   ECG  Sinus tach, 108, poor r progression.  ASSESSMENT AND PLAN  1.  Chest pain:  Pt has a 2+ yr h/o chest pain that occurs at least 1x/week, usually w/o associated Ss, and pain generally does not limit his activities.  He has been having pain since last Wednesday.  Despite prolonged Ss, he has no objective evidence of ischemia without acute changes on ECG and negative CE.  CTA chest neg for dissection or PE. Risk factors for CAD include obesity and HTN.  Though Ss are mostly atypical, his size may limit our ability to r/o ischemia via a noninvasive study.    Therefor, we will plan to proceed with a diagnostic catheterization to r/o obstructive CAD.  If cardiac w/u unrevealing rec GI f/u given h/o Schatzki's Ring.  Cont PPI.    2.  Dizziness:  Pt describes vertiginous like Ss over the weekend, associated with n/v.  He is currently Ss free with the exception of chest pain.  ? Viral illness.  Consider meclizine.  3.  HTN:  Stable.   4.  HL:  On statin.  5.  H/O Schatzki's Ring/GERD:  Cont PPI.  PRN maalox.  6.  Hypokalemia:  Supplemented earlier.  7. Polycythemia  Signed, Christopher Berge, NP 02/15/2013, 8:58 AM  Patient seen and examined independently. Chris Berge's, NP note reviewed carefully - agree with his assessment and plan. I have edited the note based on my findings. CP has typical and atypical features. His ECG and troponins are normal. We had a long discussion about stress testing vs cardiac catheterization. Given the degree and persistence of his CP, he desires definitive evaluation with cath.  Polycythemia and symptoms very concerning for OSA. Will need outpatient sleep study and weight loss.  Mortimer Bair,MD 11:15 AM        

## 2013-02-15 NOTE — H&P (View-Only) (Signed)
CARDIOLOGY CONSULT NOTE  Patient ID: Vincent Hicks MRN: [id number], DOB/AGE: August 07, 1971   Admit date: 02/14/2013 Date of Consult: 02/15/2013  Primary Physician: No primary provider on file. Primary Cardiologist: new - seen by D. Jamaurion Slemmer, MD   Pt. Profile  42 y/o male with h/o chest pain who presented to the ED yesterday 2/2 worsening Ss with dizziness, n, v.  Problem List  Past Medical History  Diagnosis Date  . Hypertension   . Hypercholesteremia   . GERD (gastroesophageal reflux disease)   . Schatzki's ring     a. s/p dilatations in the past.  . Chest pain   . Palpitations     a. seen by cardiology in [place] and apparently wore a monitor - unrevealing (2012).  . Obesity     Past Surgical History  Procedure Laterality Date  . Rotator cuff repair      Allergies  No Known Allergies  HPI   Vincent Hicks is a 42 y/o M with a h/o HTN and hypercholesterolemia who has experienced intermittent chest pain for 2 years which has worsened, becoming more frequent and accompanied by increased amount of s/s.  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  His Ss r/t to this were predominantly dysphagia, which he has not had any recurrence of since his last dilatation.  He is on a PPI @ home.  For the past 2 years, he has been experiencing intermittent chest pressure with occasional radiating pain to the left shoulder/arm occuring on average 1x/week and lasting for 2-3 days at a time generally w/o associated Ss. With each episode the pressure is constant but the pain fluctuates, reaching 9.5/10 severity at its worst. Two years ago at onset of these symptoms, he was seen in the ED here and then referred to cardiology.  At the time, he was living in [place] and saw cardiology out there.  He wore a Holter monitor for 24 hours, which was apparently unrevealing but did not undergo stress testing of any kind.   Starting 7/2, he  developed recurrent chest pressure that has been constant.  Beginning on 7/4, he noted dizziness (room spinning), which was initially mild but worsened significantly on Saturday and later became associated with nausea and vomiting (5 episodes Saturdayy night).  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  He felt somewhat better on Sunday morning, though chest pain persisted, but later in the day, dizziness recurred.  His wife convinced him to come into the ED yesterday evening and did have some relief with morphine and zofran and then more relief with dilaudid.   Chest pain persists this AM @ lower level.  It is not worse with deep breathing, palpation, position changes, or coughing.  ECG is non-acute and initial troponins are negative x 2.  Inpatient Medications  . aspirin  325 mg Oral Daily  . enoxaparin (LOVENOX) injection  40 mg Subcutaneous Q24H  . nitroGLYCERIN  0.5 inch Topical Q6H  . pantoprazole  40 mg Oral Daily  . simvastatin  10 mg Oral q1800  . sodium chloride  3 mL Intravenous Q12H   Family History Family History  Problem Relation Age of Onset  . COPD Mother     Social History History   Social History  . Marital Status: Legally Separated    Spouse Name: N/A    Number of Children: N/A  . Years of Education: N/A  Occupational History  . Not on file.   Social History Main Topics  . Smoking status: Never Smoker   . Smokeless tobacco: Not on file  . Alcohol Use: Yes     Comment: "social drinking"  . Drug Use: No  . Sexually Active: Not on file   Other Topics Concern  . Not on file   Social History Narrative   Lives in [place] with his wife.  Owns/works at [employer] and [place] in [place] [employer] [place].  Often works until [employer].  Does not routinely exercise.    Review of Systems  General:  No chills, fever, night sweats or weight changes.  Cardiovascular:  +++ chest pain, No edema, orthopnea.  Respiratory: No cough. + Intermittent SOB. Abdominal:   No nausea,  vomiting since Saturday. Neurologic:  + dizziness.  No visual changes, wkns, changes in mental status. All other systems reviewed and are otherwise negative except as noted above.  Physical Exam  Blood pressure 125/78, pulse 71, temperature 97.8 F (36.6 C), temperature source Oral, resp. rate 16, height 5\' 11"  (1.803 m), weight 280 lb (127.007 kg), SpO2 96.00%.  General: Pleasant, NAD Psych: Normal affect. Neuro: Alert and oriented X 3. Moves all extremities spontaneously. HEENT: Normal  Neck: Supple without bruits or JVD. Lungs:  Resp regular and unlabored, CTAB. Heart: RRR no s3, s4, or murmurs. Abdomen: Soft, non-tender, non-distended, BS + x 4.  Extremities: No clubbing, cyanosis or edema. DP/PT/Radials 2+ and equal bilaterally.  Labs   Recent Labs  02/14/13 2308 02/15/13 0355  TROPONINI <0.30 <0.30   Lab Results  Component Value Date   WBC 8.4 02/15/2013   HGB 16.0 02/15/2013   HCT 45.7 02/15/2013   MCV 88.2 02/15/2013   PLT 232 02/15/2013     Recent Labs Lab 02/15/13 0355  NA 133*  K 3.0*  CL 93*  CO2 27  BUN 15  CREATININE 1.13  CALCIUM 8.5  GLUCOSE 103*   Lab Results  Component Value Date   DDIMER <0.27 02/14/2013   Radiology/Studies  Dg Chest 2 View (if Patient Has Fever And/or Copd)  02/14/2013   *RADIOLOGY REPORT*  Clinical Data: Left-sided chest pain.  CHEST - 2 VIEW  Comparison: None.  Findings: Heart and mediastinal contours are within normal limits. No focal opacities or effusions.  No acute bony abnormality.  Mild peribronchial thickening.  IMPRESSION: Mild bronchitic changes.   Original Report Authenticated By: Charlett Nose, M.D.   Ct Angio Chest Aortic Dissect W &/or W/o  02/15/2013   *RADIOLOGY REPORT*  Clinical Data: Intermittent chest pain on the left radiating to the left arm associated with shortness of breath, dizziness, vomiting, nausea, and diaphoresis.  CT ANGIOGRAPHY CHEST    IMPRESSION: No evidence of aortic dissection or pulmonary embolus.  No  evidence of active pulmonary disease.   Original Report Authenticated By: Burman Nieves, M.D.   ECG  Sinus tach, 108, poor r progression.  ASSESSMENT AND PLAN  1.  Chest pain:  Pt has a 2+ yr h/o chest pain that occurs at least 1x/week, usually w/o associated Ss, and pain generally does not limit his activities.  He has been having pain since last Wednesday.  Despite prolonged Ss, he has no objective evidence of ischemia without acute changes on ECG and negative CE.  CTA chest neg for dissection or PE. Risk factors for CAD include obesity and HTN.  Though Ss are mostly atypical, his size may limit our ability to r/o ischemia via a noninvasive study.  Therefor, we will plan to proceed with a diagnostic catheterization to r/o obstructive CAD.  If cardiac w/u unrevealing rec GI f/u given h/o Schatzki's Ring.  Cont PPI.    2.  Dizziness:  Pt describes vertiginous like Ss over the weekend, associated with n/v.  He is currently Ss free with the exception of chest pain.  ? Viral illness.  Consider meclizine.  3.  HTN:  Stable.   4.  HL:  On statin.  5.  H/O Schatzki's Ring/GERD:  Cont PPI.  PRN maalox.  6.  Hypokalemia:  Supplemented earlier.  7. Polycythemia  Signed, Nicolasa Ducking, NP 02/15/2013, 8:58 AM  Patient seen and examined independently. Gilford Raid, NP note reviewed carefully - agree with his assessment and plan. I have edited the note based on my findings. CP has typical and atypical features. His ECG and troponins are normal. We had a long discussion about stress testing vs cardiac catheterization. Given the degree and persistence of his CP, he desires definitive evaluation with cath.  Polycythemia and symptoms very concerning for OSA. Will need outpatient sleep study and weight loss.  Theressa Piedra,MD 11:15 AM

## 2013-02-16 DIAGNOSIS — R0789 Other chest pain: Secondary | ICD-10-CM

## 2013-02-16 DIAGNOSIS — K219 Gastro-esophageal reflux disease without esophagitis: Secondary | ICD-10-CM

## 2013-02-16 DIAGNOSIS — R079 Chest pain, unspecified: Secondary | ICD-10-CM

## 2013-02-16 LAB — BASIC METABOLIC PANEL
Calcium: 8.6 mg/dL (ref 8.4–10.5)
GFR calc non Af Amer: >90 mL/min (ref >90)
Glucose, Bld: 103 mg/dL — ABNORMAL HIGH (ref 70–99)
Sodium: 133 mEq/L — ABNORMAL LOW (ref 135–145)

## 2013-02-16 LAB — CBC
HCT: 42.8 % (ref 39.0–52.0)
Hemoglobin: 14.3 g/dL (ref 13.0–17.0)
MCV: 90.3 fL (ref 78.0–100.0)

## 2013-02-16 MED ORDER — LISINOPRIL 40 MG PO TABS: 40.0000 mg | ORAL_TABLET | Freq: Every day | ORAL | Status: DC

## 2013-02-16 MED ORDER — LANSOPRAZOLE 30 MG PO TBDP: 30.0000 mg | ORAL_TABLET | Freq: Every day | ORAL | Status: DC

## 2013-02-16 MED ORDER — AMLODIPINE BESYLATE 5 MG PO TABS: 5.0000 mg | ORAL_TABLET | Freq: Every day | ORAL | Status: DC

## 2013-02-16 MED ORDER — LANSOPRAZOLE 30 MG PO CPDR: 30.0000 mg | DELAYED_RELEASE_CAPSULE | Freq: Every day | ORAL | Status: DC

## 2013-02-16 NOTE — Progress Notes (Signed)
PROGRESS NOTE  Subjective:   Pt is doing well.  Normal cors by cath yesterday.  Objective:    Vital Signs:   Temp:  [97.6 F (36.4 C)-98.1 F (36.7 C)] 98 F (36.7 C) (07/08 0743) Pulse Rate:  [66-92] 80 (07/08 0743) Resp:  [12-22] 18 (07/08 0743) BP: (108-136)/(53-93) 125/70 mmHg (07/08 0743) SpO2:  [95 %-99 %] 98 % (07/08 0743) Weight:  [281 lb 1.4 oz (127.5 kg)] 281 lb 1.4 oz (127.5 kg) (07/08 0049)  Last BM Date: 02/14/13   24-hour weight change: Weight change: 1 lb 1.4 oz (0.493 kg)  Weight trends: Filed Weights   02/14/13 1821 02/16/13 0049  Weight: 280 lb (127.007 kg) 281 lb 1.4 oz (127.5 kg)    Intake/Output:  07/07 0701 - 07/08 0700 In: 1335.5 [P.O.:240; I.V.:1095.5] Out: 300 [Urine:300]     Physical Exam: BP 125/70  Pulse 80  Temp(Src) 98 F (36.7 C) (Oral)  Resp 18  Ht 5\' 11"  (1.803 m)  Wt 281 lb 1.4 oz (127.5 kg)  BMI 39.22 kg/m2  SpO2 98%  General: Vital signs reviewed and noted.   Head: Normocephalic, atraumatic.  Eyes: conjunctivae/corneas clear.  EOM's intact.   Throat: normal  Neck:  normal   Lungs:    clear  Heart:  RR, normal S1, S2  Abdomen:  Soft, non-tender, non-distended    Extremities: Right radial cath site is normal   Neurologic: A&O X3, CN II - XII are grossly intact.   Psych: Normal     Labs: BMET:  Recent Labs  02/15/13 0355 02/15/13 1415 02/16/13 0458  NA 133*  --  133*  K 3.0* 3.7 3.8  CL 93*  --  97  CO2 27  --  30  GLUCOSE 103*  --  103*  BUN 15  --  13  CREATININE 1.13  --  0.98  CALCIUM 8.5  --  8.6    Liver function tests: No results found for this basename: AST, ALT, ALKPHOS, BILITOT, PROT, ALBUMIN,  in the last 72 hours No results found for this basename: LIPASE, AMYLASE,  in the last 72 hours  CBC:  Recent Labs  02/15/13 0355 02/16/13 0458  WBC 8.4 7.8  HGB 16.0 14.3  HCT 45.7 42.8  MCV 88.2 90.3  PLT 232 184    Cardiac Enzymes:  Recent Labs  02/14/13 2308 02/15/13 0355  02/15/13 1112  TROPONINI <0.30 <0.30 <0.30    Coagulation Studies:  Recent Labs  02/15/13 1415  LABPROT 13.5  INR 1.05    Other: No components found with this basename: POCBNP,   Recent Labs  02/14/13 2120  DDIMER <0.27   No results found for this basename: HGBA1C,  in the last 72 hours No results found for this basename: CHOL, HDL, LDLCALC, TRIG, CHOLHDL,  in the last 72 hours No results found for this basename: TSH, T4TOTAL, FREET3, T3FREE, THYROIDAB,  in the last 72 hours No results found for this basename: VITAMINB12, FOLATE, FERRITIN, TIBC, IRON, RETICCTPCT,  in the last 72 hours   Other results:  Tele:  NSR  Medications:    Infusions: . sodium chloride      Scheduled Medications: . aspirin  325 mg Oral Daily  . enoxaparin (LOVENOX) injection  40 mg Subcutaneous Q24H  . nitroGLYCERIN  0.5 inch Topical Q6H  . pantoprazole  40 mg Oral Daily  . simvastatin  10 mg Oral q1800    Assessment/ Plan:   Principal Problem:   Midsternal chest  pain - non cardiac pain Active Problems:   Hypertension   Hypercholesteremia   GERD (gastroesophageal reflux disease)   Polycythemia   History of Schatzki's ring   Hyponatremia   Hypokalemia   Disposition: to home today.follow up with his medical doctor.  He does not need to see [place] unless he has additional questions.  Length of Stay: 2  Vesta Mixer, Montez Hageman., MD, [facility] 02/16/2013, 8:15 AM Office [phone] Pager [phone]

## 2013-02-16 NOTE — Discharge Summary (Signed)
Physician Discharge Summary  Gertrude Tarbet WUJ:[id number] DOB: [birth year] DOA: 02/14/2013  PCP: No primary provider on file.  Admit date: 02/14/2013 Discharge date: 02/16/2013  Time spent:30* minutes  Recommendations for Outpatient Follow-up:  1. Follow up with primary MD regarding BP/HTN 2. Follow up with gastroenterologist in [facility] if chest discomfort persists for two weeks after taking Prevacid  Discharge Diagnoses:  Principal Problem:   Midsternal chest pain-noncardiac-normal cath this admit Active Problems:   Hypertension   GERD (gastroesophageal reflux disease)   History of Schatzki's ring   Hyponatremia/ Hypokalemia due to dehydration from diuretic therapy pre admit   Hypercholesteremia   Polycythemia   Discharge Condition: stable  Diet recommendation: Regular, low fat (see sheet re: GERD diet)  Filed Weights   02/14/13 1821 02/16/13 0049  Weight: 127.007 kg (280 lb) 127.5 kg (281 lb 1.4 oz)    History of present illness:  42 y.o. male with a history of HTN who presented to the ED with complaints of intermittent Chest Pain of the left chest - radiating into his left arm and associated with SOB, dizziness, nausea, vomiting and diaphoresis. He described the pain as chest tightness, and rated the pain as a 10/10 when it was at the worse. He reported having chest pain off an on for several years but the "pain" for the last 3 days has been worse. He was evlauated in the ED and his initial workup was negative and he was referred for medical admission.   Hospital Course:  Left chest pressure  -EKG and enzymes were negative  -pain was described as "pressure"- waxing and waning since admit  -Cards proceeded with cardiac cath based on pt preference for definitive testing -cath demonstrated normal coronaries and LV function   Hypertension  -home meds were held due to relative hypotension -SBP 120's on date of dc so plan resume anti-HTN meds 02/17/13   GERD (gastroesophageal  reflux disease) / History of Schatzki's ring  -10 years ago had definitive testing (ambulatroy pH monitoring) to confirm severe reflux  -has undergone dilatation of Schatzki's ring x 2 in past  -states did not have classic reflux sx's at home so stopped PPI  -oupt GI eval would be next appropriate step -prefers Prevacid so have ordered at dc   Hyponatremia/ Hypokalemia  -due to thiazide diuretics pre admit  -HCTZ held at admission -sodium 133 on date of dc -will hold diuretic until can be re-evaluated by PCP after dc  Hypercholesteremia  -continue Mevacor    Polycythemia  -baseline Hct unknown but clinically appeared dehydrated given electrolyte disturbance and relative hypotension -Hgb has decreased from 17 to 14 after hydration -see above re: holding of HCTZ for now   Procedures: Cardiac catherization: 1. Smooth and normal coronaries.  2. Normal LV function. 3.normal LV function. EF 65-70%.     Consultations:  Dr. Nahser/Cardiology  Discharge Exam: Filed Vitals:   02/15/13 2004 02/16/13 0049 02/16/13 0502 02/16/13 0743  BP: 117/53 124/72 136/93 125/70  Pulse: 72 71 75 80  Temp: 97.9 F (36.6 C) 97.7 F (36.5 C) 98.1 F (36.7 C) 98 F (36.7 C)  TempSrc: Oral Oral Oral Oral  Resp: 16 18 20 18   Height:      Weight:  127.5 kg (281 lb 1.4 oz)    SpO2: 95% 96% 97% 98%   General: No acute respiratory distress  Lungs: Clear to auscultation bilaterally without wheezes or crackles, RA  Cardiovascular: Regular rate and rhythm without murmur gallop or rub normal S1  and S2, no peripheral edema or JVD  Abdomen: Nontender, nondistended, soft, bowel sounds positive, no rebound, no ascites, no appreciable mass  Musculoskeletal: No significant cyanosis, clubbing of bilateral lower extremities  Neurological: Alert and oriented x 3, moves all extremities x 4 without focal neurological deficits, CN 2-12 intact    Discharge Instructions      Discharge Orders   Future Orders  Complete By Expires     Call MD for:  extreme fatigue  As directed     Call MD for:  persistant dizziness or light-headedness  As directed     Call MD for:  persistant nausea and vomiting  As directed     Call MD for:  temperature >100.4  As directed     Diet general  As directed     Scheduling Instructions:      Low fat- see attached GERD diet    Increase activity slowly  As directed         Medication List    STOP taking these medications       hydrochlorothiazide 25 MG tablet  Commonly known as:  HYDRODIURIL     omeprazole 20 MG capsule  Commonly known as:  PRILOSEC      TAKE these medications       amLODipine 5 MG tablet  Commonly known as:  NORVASC  Take 1 tablet (5 mg total) by mouth daily.  Start taking on:  02/17/2013     lansoprazole 30 MG capsule  Commonly known as:  PREVACID  Take 1 capsule (30 mg total) by mouth daily.     lisinopril 40 MG tablet  Commonly known as:  PRINIVIL,ZESTRIL  Take 1 tablet (40 mg total) by mouth daily.  Start taking on:  02/17/2013     lovastatin 10 MG tablet  Commonly known as:  MEVACOR  Take 20 mg by mouth daily.       No Known Allergies Follow-up Information   Please follow up.   Contact information:   Call your family doctor to be seen regarding your blood pressure. We are STOPPING your fluid pill for now      Please follow up.   Contact information:   Please follow up with your gastroenterologist if you still have symptoms such as chest discomfort after regularly taking the Prevacid for two or more weeks       The results of significant diagnostics from this hospitalization (including imaging, microbiology, ancillary and laboratory) are listed below for reference.    Significant Diagnostic Studies: Dg Chest 2 View (if Patient Has Fever And/or Copd)  02/14/2013   *RADIOLOGY REPORT*  Clinical Data: Left-sided chest pain.  CHEST - 2 VIEW  Comparison: None.  Findings: Heart and mediastinal contours are within normal  limits. No focal opacities or effusions.  No acute bony abnormality.  Mild peribronchial thickening.  IMPRESSION: Mild bronchitic changes.   Original Report Authenticated By: Charlett Nose, M.D.   Ct Angio Chest Aortic Dissect W &/or W/o  02/15/2013   *RADIOLOGY REPORT*  Clinical Data: Intermittent chest pain on the left radiating to the left arm associated with shortness of breath, dizziness, vomiting, nausea, and diaphoresis.  CT ANGIOGRAPHY CHEST  Technique:  Multidetector CT imaging of the chest using the standard protocol during bolus administration of intravenous contrast. Multiplanar reconstructed images including MIPs were obtained and reviewed to evaluate the vascular anatomy.  Contrast: OMNIPAQUE IOHEXOL 350 MG/ML SOLN  Comparison: None.  Findings: Noncontrast CT images of the chest  demonstrate no evidence of intramural hematoma.  No significant coronary artery calcification.  No significant aortic calcification.  Normal caliber thoracic aorta.  No evidence of aneurysm or dissection.  Normal appearance of the great vessels.  Normal heart size.  Central pulmonary arteries are well opacified without evidence of pulmonary embolus.  No significant lymphadenopathy in the chest.  Esophagus is decompressed. Visualization of the lung fields is limited due to respiratory motion artifact but there appears to be patchy emphysematous change and scarring in the apices.  Dependent changes in the lung bases. No focal airspace consolidation.  No pneumothorax.  No pleural effusions.  Visualized portions of the upper abdominal organs demonstrate mild fatty infiltration of the liver.  Normal alignment of the thoracic spine.  No destructive bone lesions appreciated.  IMPRESSION: No evidence of aortic dissection or pulmonary embolus.  No evidence of active pulmonary disease.   Original Report Authenticated By: Burman Nieves, M.D.    Microbiology: Recent Results (from the past 240 hour(s))  MRSA PCR SCREENING      Status: None   Collection Time    02/15/13  1:58 AM      Result Value Range Status   MRSA by PCR NEGATIVE  NEGATIVE Final   Comment:            The GeneXpert MRSA Assay (FDA     approved for NASAL specimens     only), is one component of a     comprehensive MRSA colonization     surveillance program. It is not     intended to diagnose MRSA     infection nor to guide or     monitor treatment for     MRSA infections.     Labs: Basic Metabolic Panel:  Recent Labs Lab 02/14/13 1828 02/15/13 0355 02/15/13 1415 02/16/13 0458  NA 131* 133*  --  133*  K 3.5 3.0* 3.7 3.8  CL 93* 93*  --  97  CO2 24 27  --  30  GLUCOSE 129* 103*  --  103*  BUN 15 15  --  13  CREATININE 1.20 1.13  --  0.98  CALCIUM 9.7 8.5  --  8.6   Liver Function Tests: No results found for this basename: AST, ALT, ALKPHOS, BILITOT, PROT, ALBUMIN,  in the last 168 hours No results found for this basename: LIPASE, AMYLASE,  in the last 168 hours No results found for this basename: AMMONIA,  in the last 168 hours CBC:  Recent Labs Lab 02/14/13 2123 02/15/13 0355 02/16/13 0458  WBC 9.3 8.4 7.8  HGB 17.6* 16.0 14.3  HCT 48.8 45.7 42.8  MCV 87.9 88.2 90.3  PLT 246 232 184   Cardiac Enzymes:  Recent Labs Lab 02/14/13 2308 02/15/13 0355 02/15/13 1112  TROPONINI <0.30 <0.30 <0.30   BNP: BNP (last 3 results)  Recent Labs  02/14/13 1828  PROBNP 35.1   CBG: No results found for this basename: GLUCAP,  in the last 168 hours     Signed:  ELLIS,ALLISON L. ANP [facility] 02/16/2013, 11:11 AM   I have examined the patient, reviewed the chart and modified the above note which I agree with.   Karlon Schlafer,MD [phone] 02/27/2013, 7:13 PM

## 2013-02-16 NOTE — Progress Notes (Signed)
Patient Name: Vincent Hicks Date of Encounter: 02/16/2013    Principal Problem:   Midsternal chest pain Active Problems:   GERD (gastroesophageal reflux disease)   History of Schatzki's ring   Hypertension   Hypercholesteremia   Polycythemia   Hyponatremia   Hypokalemia   SUBJECTIVE  42 y/o M with a h/o HTN and hypercholesterolemia who has experienced intermittent chest pain for 2 years which has worsened recently, causing him to come to the ED on 7/6. Yesterday he underwent a left heart catheterization without complications; findings via procedure were normal. Today he is well; continues to have mild persistent chest pressure but is improving. Denies SOB, HA, dizziness, n/v. No pain or irritation at catheterization site. Patient plans to f/u with GI as outpatient.   CURRENT MEDS . aspirin  325 mg Oral Daily  . enoxaparin (LOVENOX) injection  40 mg Subcutaneous Q24H  . nitroGLYCERIN  0.5 inch Topical Q6H  . pantoprazole  40 mg Oral Daily  . simvastatin  10 mg Oral q1800   OBJECTIVE  Filed Vitals:   02/15/13 1900 02/15/13 2004 02/16/13 0049 02/16/13 0502  BP: 134/91 117/53 124/72 136/93  Pulse: 66 72 71 75  Temp:  97.9 F (36.6 C) 97.7 F (36.5 C) 98.1 F (36.7 C)  TempSrc:  Oral Oral Oral  Resp:  16 18 20   Height:      Weight:   281 lb 1.4 oz (127.5 kg)   SpO2: 97% 95% 96% 97%    Intake/Output Summary (Last 24 hours) at 02/16/13 0715 Last data filed at 02/15/13 2004  Gross per 24 hour  Intake 1335.5 ml  Output    300 ml  Net 1035.5 ml   Filed Weights   02/14/13 1821 02/16/13 0049  Weight: 280 lb (127.007 kg) 281 lb 1.4 oz (127.5 kg)   PHYSICAL EXAM  General: Pleasant, NAD. Neuro: Alert and oriented X 3. Moves all extremities spontaneously. Psych: Normal affect. Lungs:  Resp regular and unlabored, CTAB. Heart: RRR no s3, s4, or murmurs. Extremities: Right wrist/hand without redness or cyanosis. Nontender to palpation and no audible bruit over R radial  artery. Full ROM of wrist/fingers without pain.   Accessory Clinical Findings  CBC  Recent Labs  02/15/13 0355 02/16/13 0458  WBC 8.4 7.8  HGB 16.0 14.3  HCT 45.7 42.8  MCV 88.2 90.3  PLT 232 184   Basic Metabolic Panel  Recent Labs  02/15/13 0355 02/15/13 1415 02/16/13 0458  NA 133*  --  133*  K 3.0* 3.7 3.8  CL 93*  --  97  CO2 27  --  30  GLUCOSE 103*  --  103*  BUN 15  --  13  CREATININE 1.13  --  0.98  CALCIUM 8.5  --  8.6   Cardiac Enzymes  Recent Labs  02/14/13 2308 02/15/13 0355 02/15/13 1112  TROPONINI <0.30 <0.30 <0.30   D-Dimer  Recent Labs  02/14/13 2120  DDIMER <0.27   TELE  rsr  ASSESSMENT AND PLAN  1. Chest pain: Pt has a 2+ yr h/o chest pain which worsened and brought him to the ED on 7/6. Yesterday he underwent a left heart catheterization which revealed no cardiac disease. GI f/u recommended given h/o Schatzki's Ring. Internal medicine to follow. OK to d/c from cardiac standpoint.  2. Dizziness: Pt describes vertiginous like Ss over the weekend, associated with n/v. He currently denies Ss other than mild, persistent chest pressure. Dizziness possibly due to viral illness?  3. HTN: Stable.   4. HL: On statin.   5. H/O Schatzki's Ring/GERD: Cont PPI. PRN maalox. Patient plans to f/u with GI as outpatient.  6. Hypokalemia: Resolved.  7. Polycythemia: Suspect sleep apnea. Recommend outpatient sleep study.   Signed, Nicolasa Ducking NP  Attending Note:   The patient was seen and examined.  Agree with assessment and plan as noted above.  Changes made to the above note as needed.  See my note from earlier today .  He is able to go home.  Will follow up with his primary medical doctor.  Vesta Mixer, Montez Hageman., MD, [facility] 02/16/2013, 11:27 AM

## 2014-04-13 ENCOUNTER — Emergency Department: Payer: Self-pay | Admitting: Emergency Medicine

## 2014-04-13 LAB — CBC
HCT: 49.1 % (ref 40.0–52.0)
HGB: 16.4 g/dL (ref 13.0–18.0)
MCH: 30.6 pg (ref 26.0–34.0)
MCHC: 33.4 g/dL (ref 32.0–36.0)
MCV: 92 fL (ref 80–100)
Platelet: 203 10*3/uL (ref 150–440)
RBC: 5.36 10*6/uL (ref 4.40–5.90)
RDW: 13.1 % (ref 11.5–14.5)
WBC: 7.4 10*3/uL (ref 3.8–10.6)

## 2014-04-13 LAB — BASIC METABOLIC PANEL
ANION GAP: 5 — AB (ref 7–16)
BUN: 10 mg/dL (ref 7–18)
CREATININE: 1.12 mg/dL (ref 0.60–1.30)
Calcium, Total: 8.3 mg/dL — ABNORMAL LOW (ref 8.5–10.1)
Chloride: 106 mmol/L (ref 98–107)
Co2: 26 mmol/L (ref 21–32)
GLUCOSE: 97 mg/dL (ref 65–99)
OSMOLALITY: 273 (ref 275–301)
POTASSIUM: 3.9 mmol/L (ref 3.5–5.1)
SODIUM: 137 mmol/L (ref 136–145)

## 2014-04-13 LAB — TROPONIN I
Troponin-I: <0.02 ng/mL
Troponin-I: <0.02 ng/mL

## 2014-07-21 ENCOUNTER — Encounter (HOSPITAL_COMMUNITY): Payer: Self-pay | Admitting: Cardiovascular Disease

## 2014-08-30 IMAGING — CR DG CHEST 2V
2 series · 2 of 2 positions shown · non-contrast
Comparison: None.

CLINICAL DATA: Left-sided chest pain.

CHEST - 2 VIEW

[w chest pa]
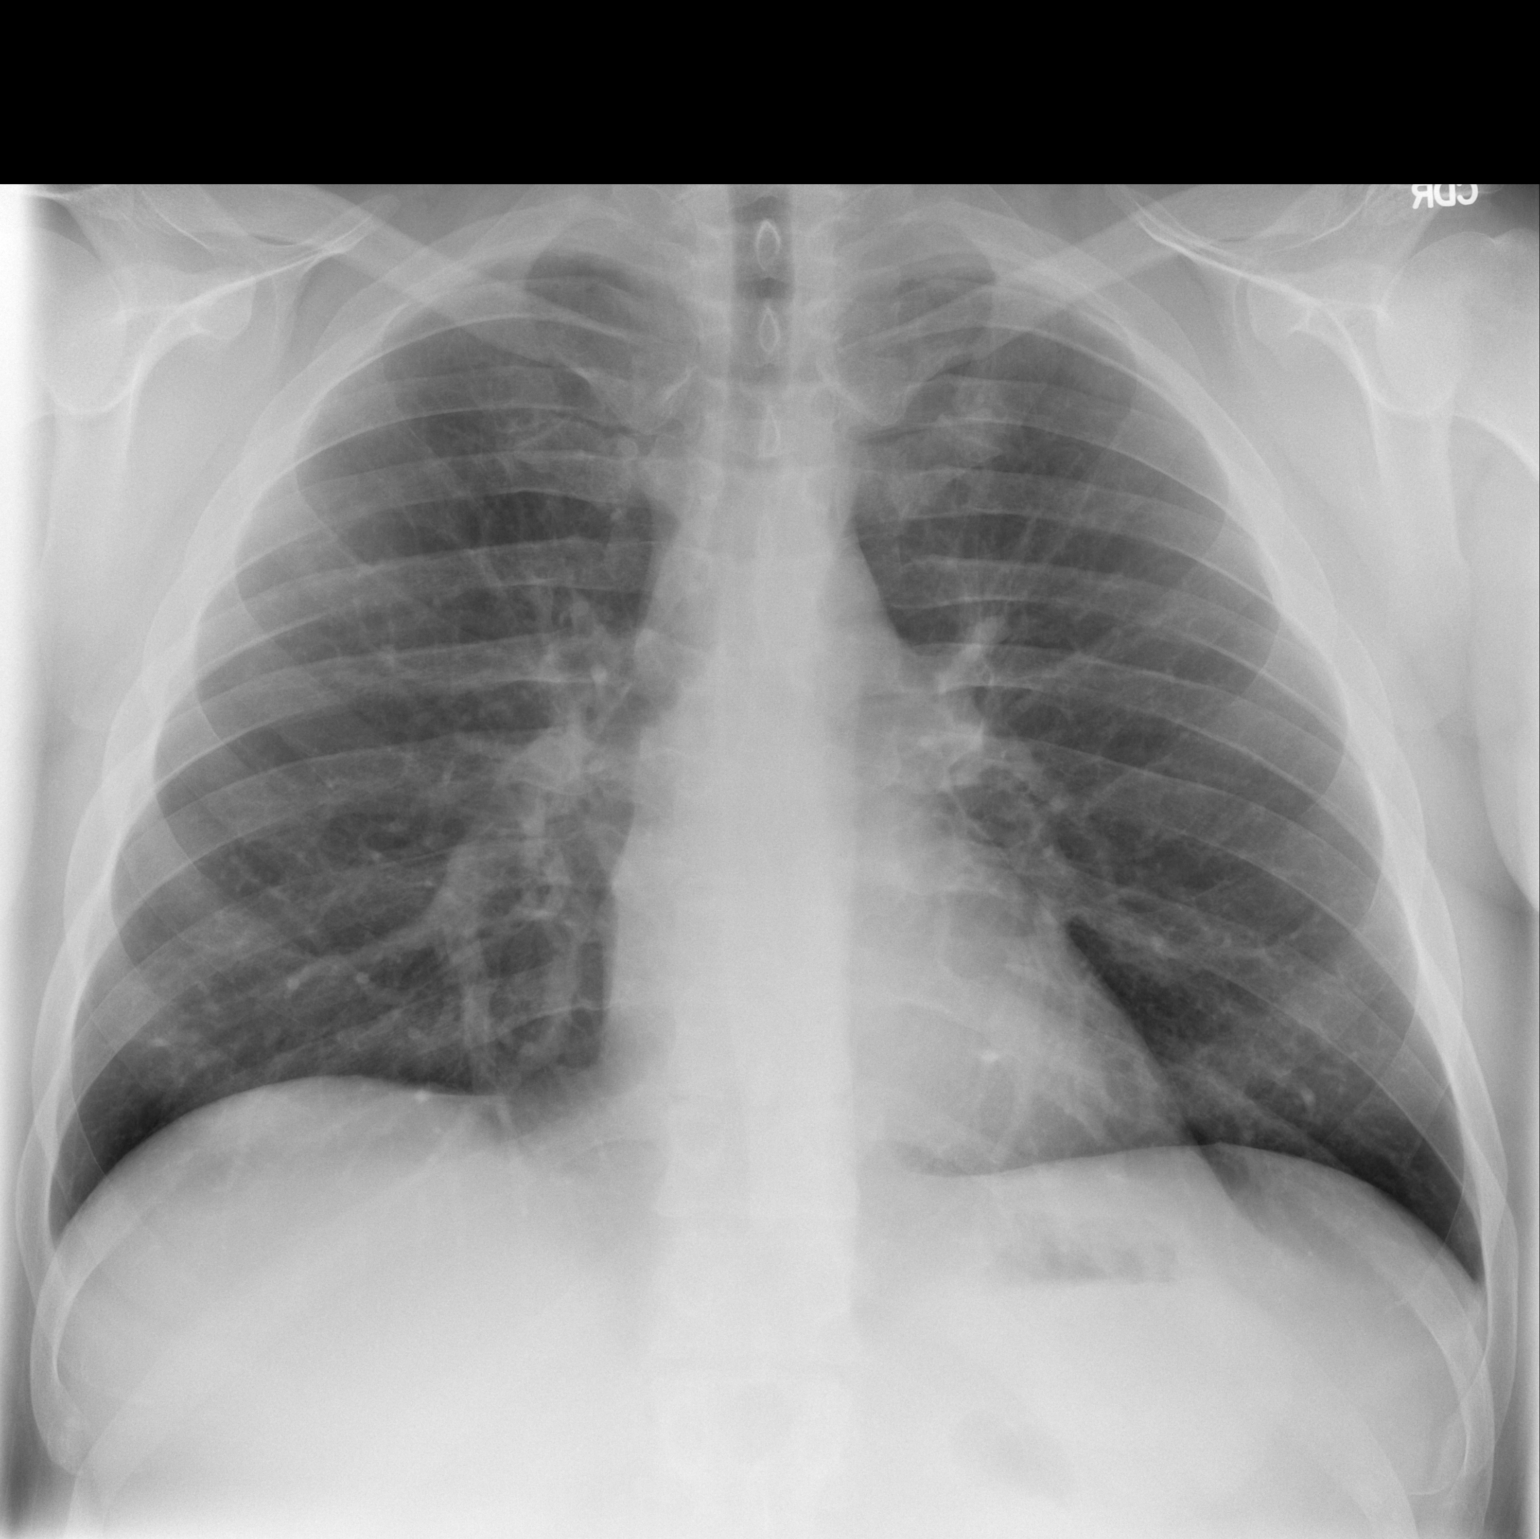

[w chest lat *]
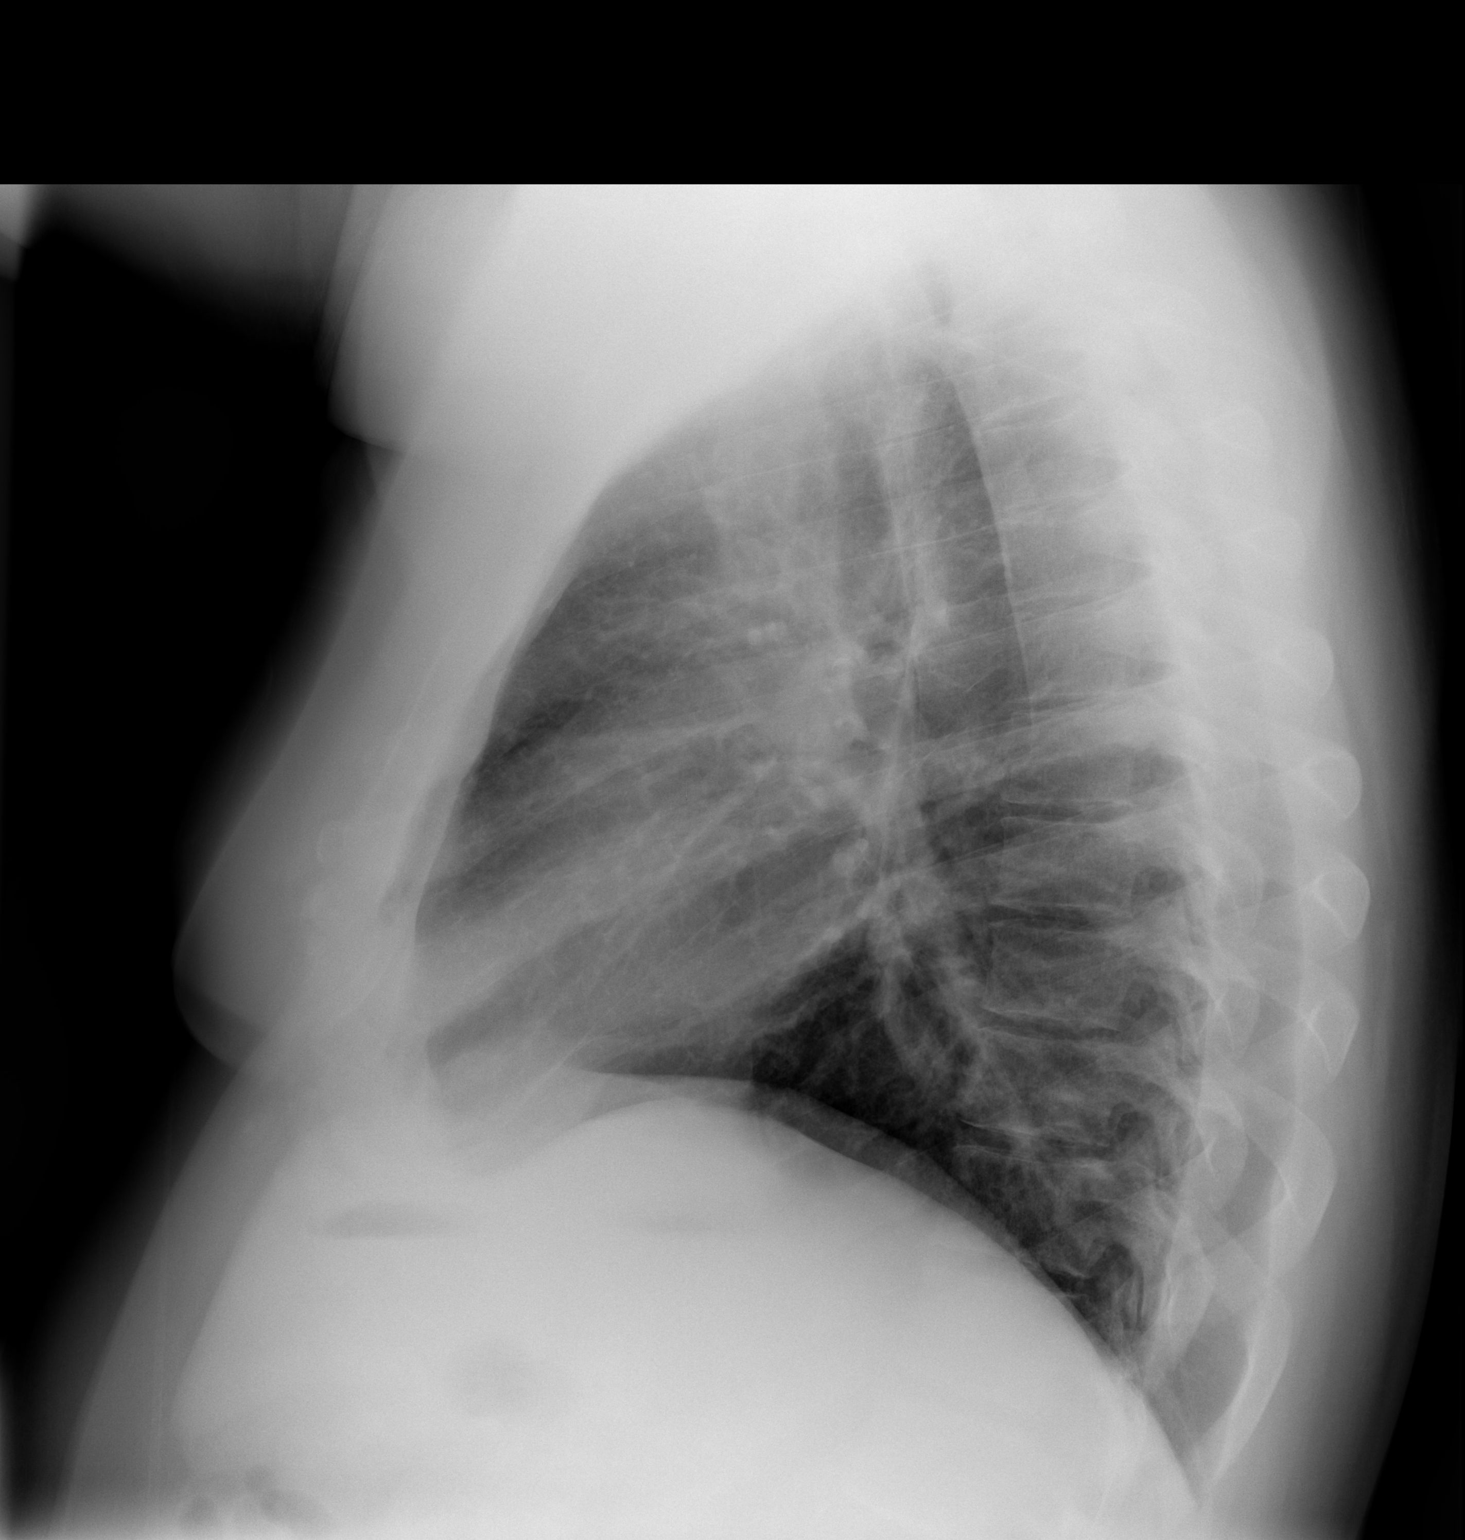

[2 of 2 positions shown; findings below may reference images not displayed]

FINDINGS: Heart and mediastinal contours are within normal limits.
No focal opacities or effusions.  No acute bony abnormality.  Mild
peribronchial thickening.
IMPRESSION: Mild bronchitic changes.

## 2014-08-31 IMAGING — CT CT ANGIO CHEST
2 of 8 series · 18 of 46 positions shown · IV contrast (APPLIED)
Comparison: None.

CLINICAL DATA: Intermittent chest pain on the left radiating to the
left arm associated with shortness of breath, dizziness, vomiting,
nausea, and diaphoresis.

CT ANGIOGRAPHY CHEST
TECHNIQUE: Multidetector CT imaging of the chest using the
standard protocol during bolus administration of intravenous
contrast. Multiplanar reconstructed images including MIPs were
obtained and reviewed to evaluate the vascular anatomy.
Contrast: 100mL OMNIPAQUE IOHEXOL 350 MG/ML SOLN

[Series 5: dissection 2.0 i30f 3 · axial · 0.80mm/px · z∈[-399,-103]mm · 15 of 164 slices shown]
[im 8/164  lung]
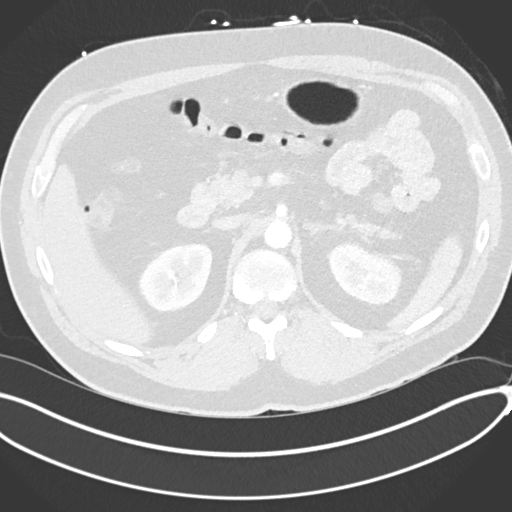
[im 24/164  soft-tissue]
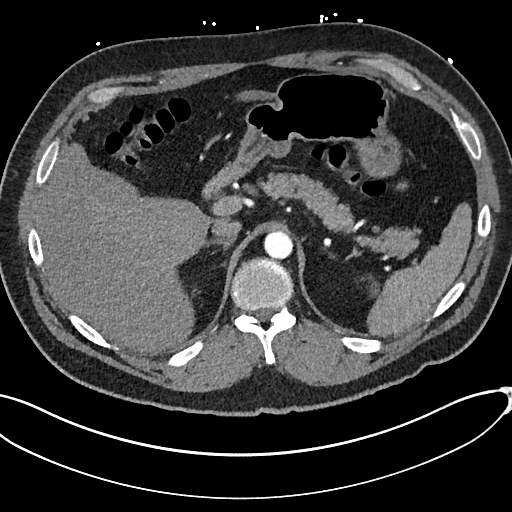
[im 32/164  lung]
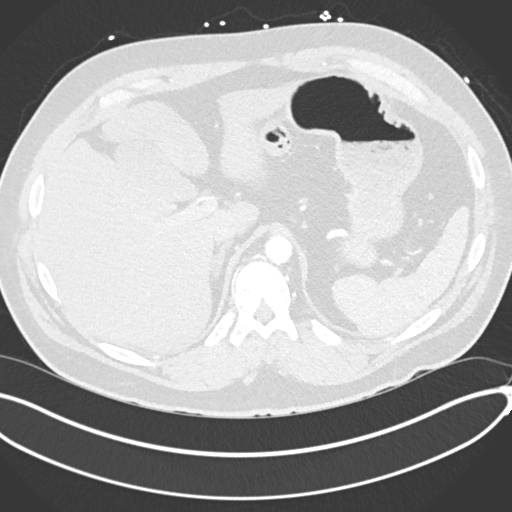
[im 39/164  soft-tissue]
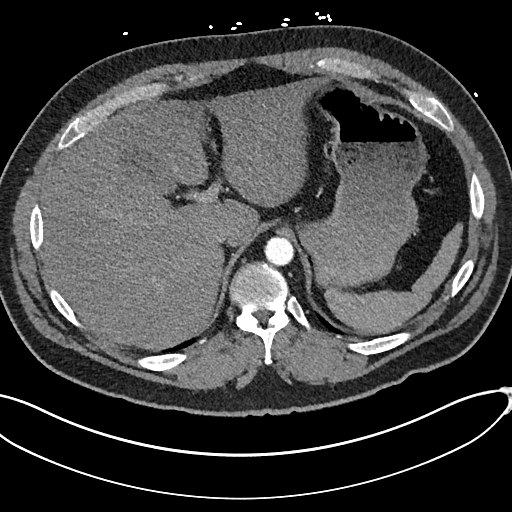
[im 55/164  lung]
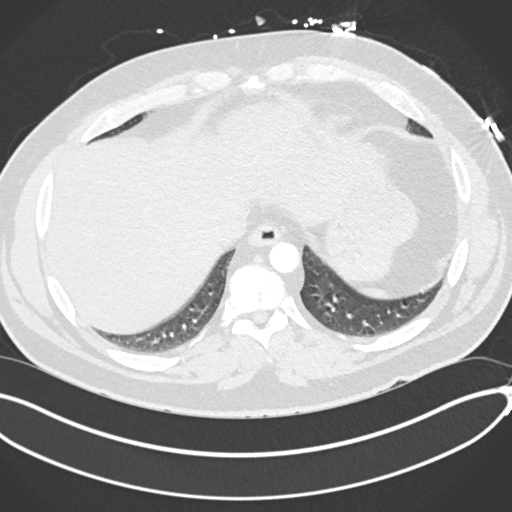
[im 63/164  soft-tissue]
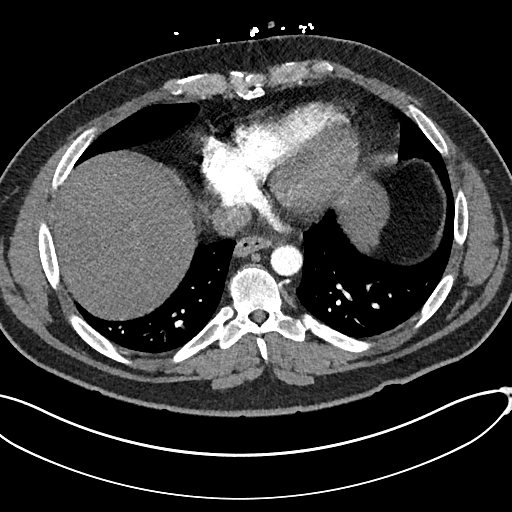
[im 70/164  lung]
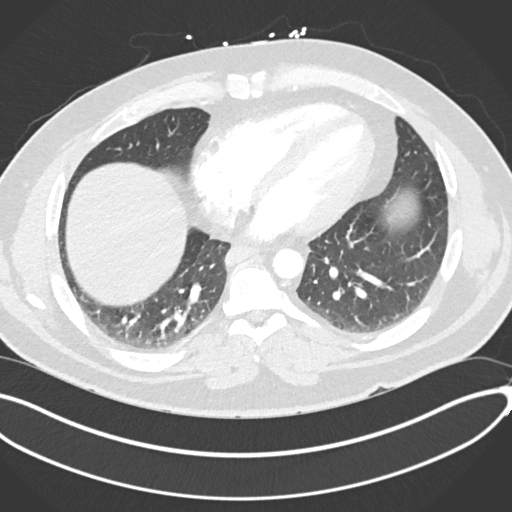
[im 86/164  soft-tissue]
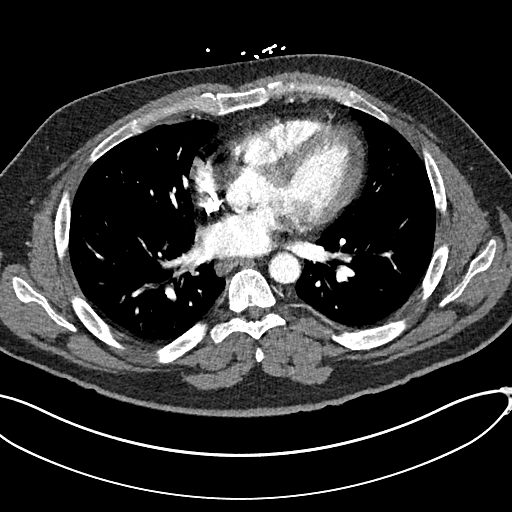
[im 94/164  lung]
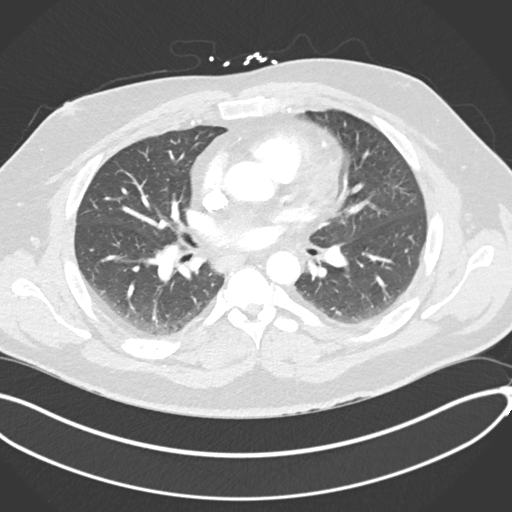
[im 101/164  soft-tissue]
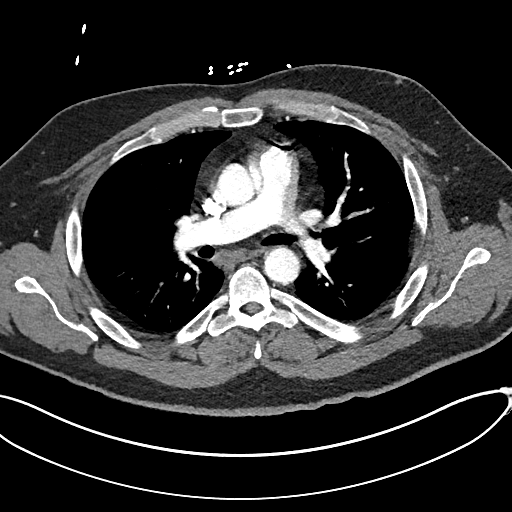
[im 117/164  lung]
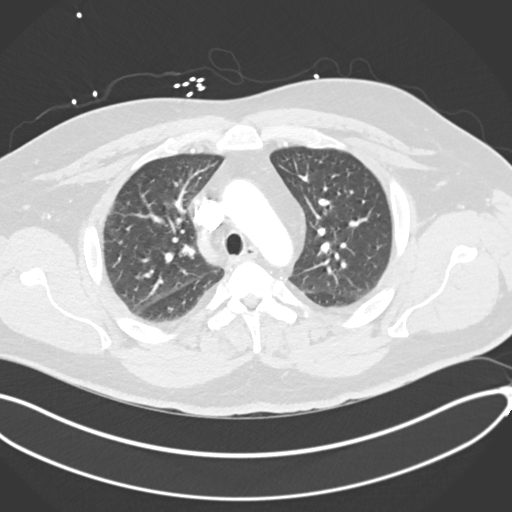
[im 125/164  soft-tissue]
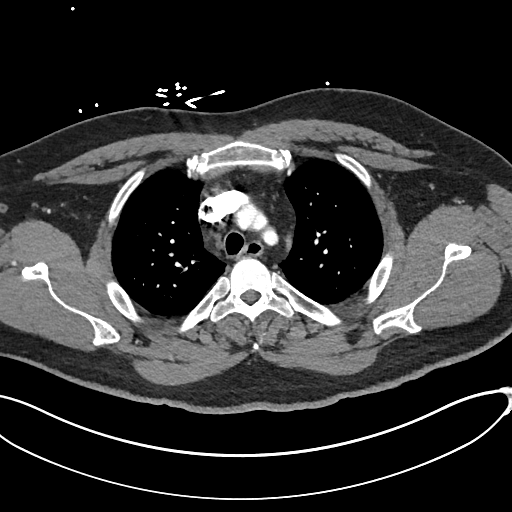
[im 132/164  lung]
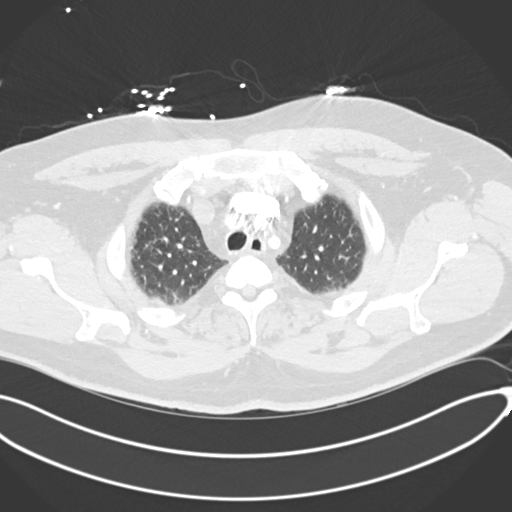
[im 148/164  soft-tissue]
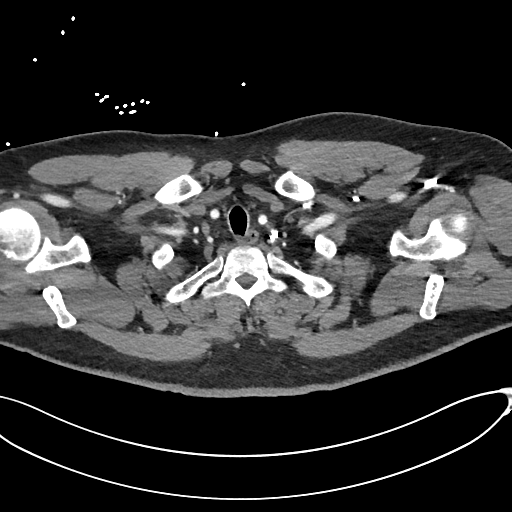
[im 156/164  lung]
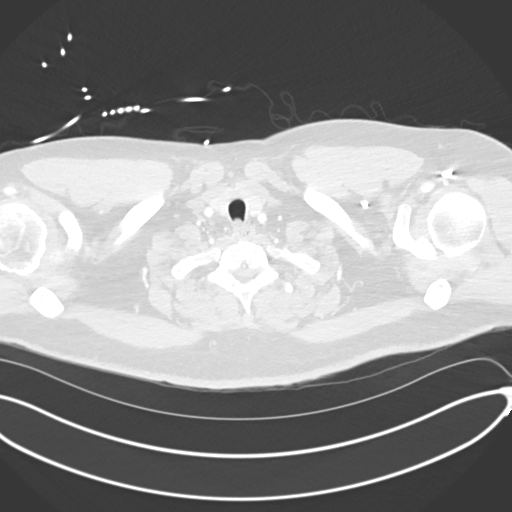

[Series 7: coronal mpr · coronal · 0.68mm/px · 3 of 140 slices shown]
[im 35/140  soft-tissue]
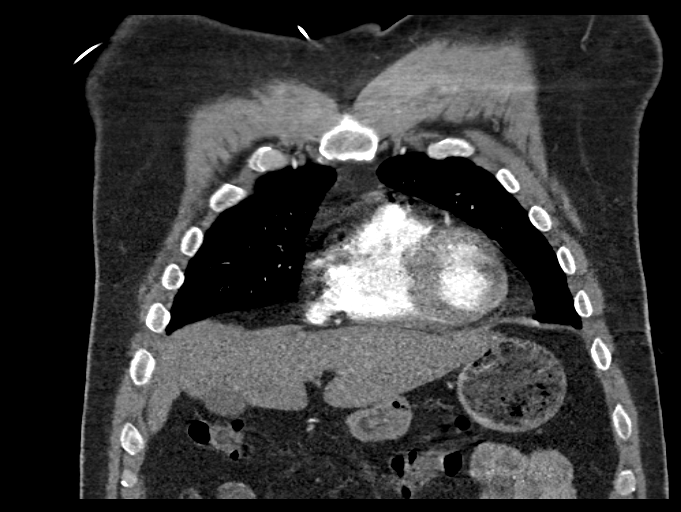
[im 70/140  soft-tissue]
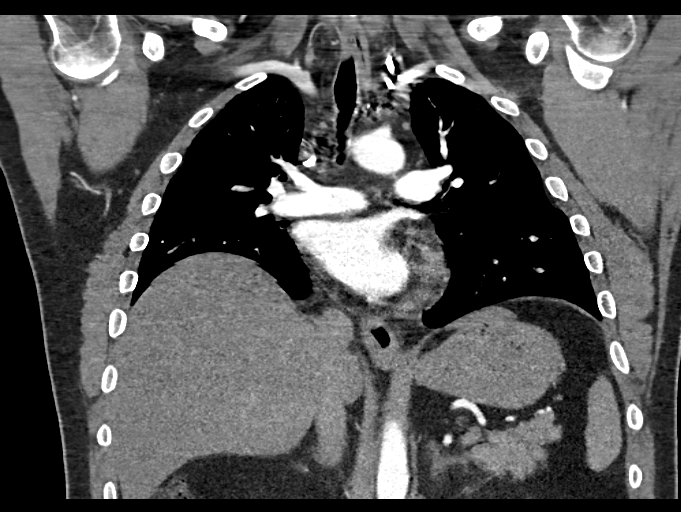
[im 105/140  soft-tissue]
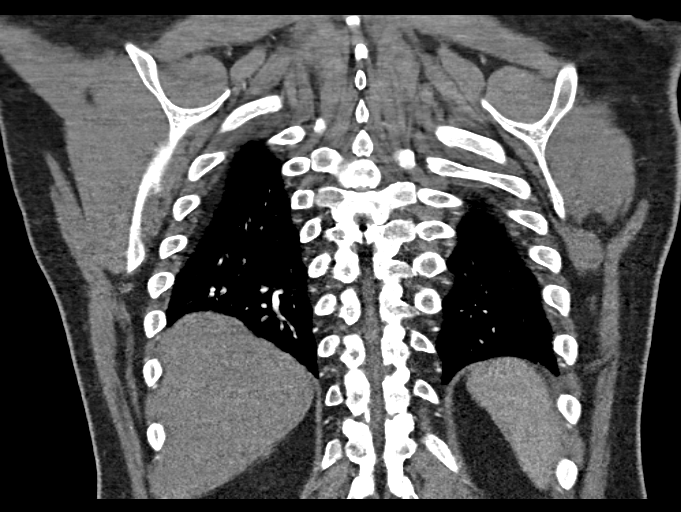

[18 of 46 positions shown; findings below may reference images not displayed]

FINDINGS: Noncontrast CT images of the chest demonstrate no
evidence of intramural hematoma.  No significant coronary artery
calcification.  No significant aortic calcification.

Normal caliber thoracic aorta.  No evidence of aneurysm or
dissection.  Normal appearance of the great vessels.

Normal heart size.  Central pulmonary arteries are well opacified
without evidence of pulmonary embolus.  No significant
lymphadenopathy in the chest.  Esophagus is decompressed.
Visualization of the lung fields is limited due to respiratory
motion artifact but there appears to be patchy emphysematous change
and scarring in the apices.  Dependent changes in the lung bases.
No focal airspace consolidation.  No pneumothorax.  No pleural
effusions.

Visualized portions of the upper abdominal organs demonstrate mild
fatty infiltration of the liver.  Normal alignment of the thoracic
spine.  No destructive bone lesions appreciated.
IMPRESSION: No evidence of aortic dissection or pulmonary embolus.  No evidence
of active pulmonary disease.

## 2015-03-06 ENCOUNTER — Encounter (HOSPITAL_COMMUNITY): Payer: Self-pay | Admitting: Emergency Medicine

## 2015-03-06 ENCOUNTER — Emergency Department (HOSPITAL_COMMUNITY): Payer: BC Managed Care – PPO

## 2015-03-06 ENCOUNTER — Emergency Department (HOSPITAL_COMMUNITY)
Admission: EM | Admit: 2015-03-06 | Discharge: 2015-03-06 | Disposition: A | Discharge status: Home / Self Care | Payer: BC Managed Care – PPO | Attending: Emergency Medicine | Admitting: Emergency Medicine

## 2015-03-06 DIAGNOSIS — E669 Obesity, unspecified: Secondary | ICD-10-CM | POA: Insufficient documentation

## 2015-03-06 DIAGNOSIS — Z79899 Other long term (current) drug therapy: Secondary | ICD-10-CM | POA: Insufficient documentation

## 2015-03-06 DIAGNOSIS — K219 Gastro-esophageal reflux disease without esophagitis: Secondary | ICD-10-CM | POA: Diagnosis not present

## 2015-03-06 DIAGNOSIS — R42 Dizziness and giddiness: Secondary | ICD-10-CM | POA: Insufficient documentation

## 2015-03-06 DIAGNOSIS — Z7982 Long term (current) use of aspirin: Secondary | ICD-10-CM | POA: Insufficient documentation

## 2015-03-06 DIAGNOSIS — E782 Mixed hyperlipidemia: Secondary | ICD-10-CM | POA: Diagnosis not present

## 2015-03-06 DIAGNOSIS — R079 Chest pain, unspecified: Secondary | ICD-10-CM | POA: Diagnosis not present

## 2015-03-06 DIAGNOSIS — I10 Essential (primary) hypertension: Secondary | ICD-10-CM | POA: Diagnosis not present

## 2015-03-06 DIAGNOSIS — R0602 Shortness of breath: Secondary | ICD-10-CM | POA: Diagnosis present

## 2015-03-06 LAB — BASIC METABOLIC PANEL
Anion gap: 10 (ref 5–15)
BUN: 11 mg/dL (ref 6–20)
CO2: 23 mmol/L (ref 22–32)
Calcium: 9.2 mg/dL (ref 8.9–10.3)
Chloride: 103 mmol/L (ref 101–111)
Creatinine, Ser: 1.09 mg/dL (ref 0.61–1.24)
GFR calc Af Amer: >60 mL/min (ref >60)
Glucose, Bld: 107 mg/dL — ABNORMAL HIGH (ref 65–99)
POTASSIUM: 4.2 mmol/L (ref 3.5–5.1)
SODIUM: 136 mmol/L (ref 135–145)

## 2015-03-06 LAB — CBC
HCT: 46.1 % (ref 39.0–52.0)
Hemoglobin: 15.7 g/dL (ref 13.0–17.0)
MCH: 30.7 pg (ref 26.0–34.0)
MCHC: 34.1 g/dL (ref 30.0–36.0)
MCV: 90 fL (ref 78.0–100.0)
Platelets: 210 10*3/uL (ref 150–400)
RBC: 5.12 MIL/uL (ref 4.22–5.81)
RDW: 12.6 % (ref 11.5–15.5)
WBC: 7 10*3/uL (ref 4.0–10.5)

## 2015-03-06 LAB — I-STAT TROPONIN, ED
TROPONIN I, POC: 0 ng/mL (ref 0.00–0.08)
TROPONIN I, POC: 0 ng/mL (ref 0.00–0.08)

## 2015-03-06 LAB — PROTIME-INR
INR: 1.1 (ref 0.00–1.49)
PROTHROMBIN TIME: 14.4 s (ref 11.6–15.2)

## 2015-03-06 MED ORDER — MORPHINE SULFATE 4 MG/ML IJ SOLN
4.0000 mg | Freq: Once | INTRAMUSCULAR | Status: AC
  Administered 2015-03-06: 4 mg via INTRAVENOUS
  Filled 2015-03-06: qty 1

## 2015-03-06 MED ORDER — ONDANSETRON HCL 4 MG/2ML IJ SOLN
4.0000 mg | Freq: Once | INTRAMUSCULAR | Status: AC
  Administered 2015-03-06: 4 mg via INTRAVENOUS
  Filled 2015-03-06: qty 2

## 2015-03-06 MED ORDER — IOHEXOL 350 MG/ML SOLN
75.0000 mL | Freq: Once | INTRAVENOUS | Status: AC | PRN
Start: 2015-03-06 — End: 2015-03-06
  Administered 2015-03-06: 75 mL via INTRAVENOUS

## 2015-03-06 MED ORDER — SODIUM CHLORIDE 0.9 % IV BOLUS (SEPSIS)
1000.0000 mL | Freq: Once | INTRAVENOUS | Status: AC
  Administered 2015-03-06: 1000 mL via INTRAVENOUS

## 2015-03-06 NOTE — Discharge Instructions (Signed)
Dizziness °Dizziness is a common problem. It is a feeling of unsteadiness or light-headedness. You may feel like you are about to faint. Dizziness can lead to injury if you stumble or fall. A person of any age group can suffer from dizziness, but dizziness is more common in older adults. °CAUSES  °Dizziness can be caused by many different things, including: °· Middle ear problems. °· Standing for too long. °· Infections. °· An allergic reaction. °· Aging. °· An emotional response to something, such as the sight of blood. °· Side effects of medicines. °· Tiredness. °· Problems with circulation or blood pressure. °· Excessive use of alcohol or medicines, or illegal drug use. °· Breathing too fast (hyperventilation). °· An irregular heart rhythm (arrhythmia). °· A low red blood cell count (anemia). °· Pregnancy. °· Vomiting, diarrhea, fever, or other illnesses that cause body fluid loss (dehydration). °· Diseases or conditions such as Parkinson's disease, high blood pressure (hypertension), diabetes, and thyroid problems. °· Exposure to extreme heat. °DIAGNOSIS  °Your health care provider will ask about your symptoms, perform a physical exam, and perform an electrocardiogram (ECG) to record the electrical activity of your heart. Your health care provider may also perform other heart or blood tests to determine the cause of your dizziness. These may include: °· Transthoracic echocardiogram (TTE). During echocardiography, sound waves are used to evaluate how blood flows through your heart. °· Transesophageal echocardiogram (TEE). °· Cardiac monitoring. This allows your health care provider to monitor your heart rate and rhythm in real time. °· Holter monitor. This is a portable device that records your heartbeat and can help diagnose heart arrhythmias. It allows your health care provider to track your heart activity for several days if needed. °· Stress tests by exercise or by giving medicine that makes the heart beat  faster. °TREATMENT  °Treatment of dizziness depends on the cause of your symptoms and can vary greatly. °HOME CARE INSTRUCTIONS  °· Drink enough fluids to keep your urine clear or pale yellow. This is especially important in very hot weather. In older adults, it is also important in cold weather. °· Take your medicine exactly as directed if your dizziness is caused by medicines. When taking blood pressure medicines, it is especially important to get up slowly. °· Rise slowly from chairs and steady yourself until you feel okay. °· In the morning, first sit up on the side of the bed. When you feel okay, stand slowly while holding onto something until you know your balance is fine. °· Move your legs often if you need to stand in one place for a long time. Tighten and relax your muscles in your legs while standing. °· Have someone stay with you for 1-2 days if dizziness continues to be a problem. Do this until you feel you are well enough to stay alone. Have the person call your health care provider if he or she notices changes in you that are concerning. °· Do not drive or use heavy machinery if you feel dizzy. °· Do not drink alcohol. °SEEK IMMEDIATE MEDICAL CARE IF:  °· Your dizziness or light-headedness gets worse. °· You feel nauseous or vomit. °· You have problems talking, walking, or using your arms, hands, or legs. °· You feel weak. °· You are not thinking clearly or you have trouble forming sentences. It may take a friend or family member to notice this. °· You have chest pain, abdominal pain, shortness of breath, or sweating. °· Your vision changes. °· You notice   any bleeding.  You have side effects from medicine that seems to be getting worse rather than better. MAKE SURE YOU:   Understand these instructions.  Will watch your condition.  Will get help right away if you are not doing well or get worse. Document Released: 01/22/2001 Document Revised: 08/03/2013 Document Reviewed: 02/15/2011 [facility]  Patient Information 2015 [place], [place]. This information is not intended to replace advice given to you by your health care provider. Make sure you discuss any questions you have with your health care provider. Chest Pain (Nonspecific) It is often hard to give a specific diagnosis for the cause of chest pain. There is always a chance that your pain could be related to something serious, such as a heart attack or a blood clot in the lungs. You need to follow up with your health care provider for further evaluation. CAUSES   Heartburn.  Pneumonia or bronchitis.  Anxiety or stress.  Inflammation around your heart (pericarditis) or lung (pleuritis or pleurisy).  A blood clot in the lung.  A collapsed lung (pneumothorax). It can develop suddenly on its own (spontaneous pneumothorax) or from trauma to the chest.  Shingles infection (herpes zoster virus). The chest wall is composed of bones, muscles, and cartilage. Any of these can be the source of the pain.  The bones can be bruised by injury.  The muscles or cartilage can be strained by coughing or overwork.  The cartilage can be affected by inflammation and become sore (costochondritis). DIAGNOSIS  Lab tests or other studies may be needed to find the cause of your pain. Your health care provider may have you take a test called an ambulatory electrocardiogram (ECG). An ECG records your heartbeat patterns over a 24-hour period. You may also have other tests, such as:  Transthoracic echocardiogram (TTE). During echocardiography, sound waves are used to evaluate how blood flows through your heart.  Transesophageal echocardiogram (TEE).  Cardiac monitoring. This allows your health care provider to monitor your heart rate and rhythm in real time.  Holter monitor. This is a portable device that records your heartbeat and can help diagnose heart arrhythmias. It allows your health care provider to track your heart activity for several days, if  needed.  Stress tests by exercise or by giving medicine that makes the heart beat faster. TREATMENT   Treatment depends on what may be causing your chest pain. Treatment may include:  Acid blockers for heartburn.  Anti-inflammatory medicine.  Pain medicine for inflammatory conditions.  Antibiotics if an infection is present.  You may be advised to change lifestyle habits. This includes stopping smoking and avoiding alcohol, caffeine, and chocolate.  You may be advised to keep your head raised (elevated) when sleeping. This reduces the chance of acid going backward from your stomach into your esophagus. Most of the time, nonspecific chest pain will improve within 2-3 days with rest and mild pain medicine.  HOME CARE INSTRUCTIONS   If antibiotics were prescribed, take them as directed. Finish them even if you start to feel better.  For the next few days, avoid physical activities that bring on chest pain. Continue physical activities as directed.  Do not use any tobacco products, including cigarettes, chewing tobacco, or electronic cigarettes.  Avoid drinking alcohol.  Only take medicine as directed by your health care provider.  Follow your health care provider's suggestions for further testing if your chest pain does not go away.  Keep any follow-up appointments you made. If you do not go to  an appointment, you could develop lasting (chronic) problems with pain. If there is any problem keeping an appointment, call to reschedule. SEEK MEDICAL CARE IF:   Your chest pain does not go away, even after treatment.  You have a rash with blisters on your chest.  You have a fever. SEEK IMMEDIATE MEDICAL CARE IF:   You have increased chest pain or pain that spreads to your arm, neck, jaw, back, or abdomen.  You have shortness of breath.  You have an increasing cough, or you cough up blood.  You have severe back or abdominal pain.  You feel nauseous or vomit.  You have severe  weakness.  You faint.  You have chills. This is an emergency. Do not wait to see if the pain will go away. Get medical help at once. Call your local emergency services (911 in [place] Do not drive yourself to the hospital. MAKE SURE YOU:   Understand these instructions.  Will watch your condition.  Will get help right away if you are not doing well or get worse. Document Released: 05/08/2005 Document Revised: 08/03/2013 Document Reviewed: 03/03/2008 [facility] Patient Information 2015 [place], [place]. This information is not intended to replace advice given to you by your health care provider. Make sure you discuss any questions you have with your health care provider.

## 2015-03-06 NOTE — ED Notes (Signed)
Pt is in stable condition upon d/c and ambulates from ED. 

## 2015-03-06 NOTE — ED Notes (Signed)
Pt here for dizziness, chest pain and SOB. sts started last night.

## 2015-03-06 NOTE — ED Provider Notes (Signed)
CSN: [id number]     Arrival date & time 03/06/15  1142 History   First MD Initiated Contact with Patient 03/06/15 1244     Chief Complaint  Patient presents with  . Dizziness  . Chest Pain  . Shortness of Breath   (Consider location/radiation/quality/duration/timing/severity/associated sxs/prior Treatment) HPI   CARDIAC CATH Thayer Headings, Brooke Bonito., MD, [facility] 02/15/2013, 5:48 PM Conclusions:  1. Smooth and normal coronaries.  2. Normal LV function.   PCP: No primary care provider on file. Blood pressure 161/111, pulse 92, temperature 98 F (36.7 C), resp. rate 18, SpO2 100 %.  Vincent Hicks is a 45 y.o.male with a significant PMH of hypertension, hypercholesteremia, GERD esophagitis- schatzki's ring, CP, palpitations, obesity presents to the ER with complaints of dizziness, chest pain and SOB that started last night. He has been experiencing dizziness over the past few days and this has improved in the way that it is no longer constant and now intermittent. His wife encouraged him to come in after he was too weak to get from the couch into the kitchen, he became very dizzy, short of breath and had a significant pressure to his left chest. Denies syncope, LE swelling. He recently traveled to the beach for a conference within the past week. He is having CP at 5/10 currently at rest. Not currently dizzy or SOB.  The patient denies diaphoresis, fever, headache, weakness ( focal), confusion, change of vision,  neck pain, dysphagia, aphagia,back pain, abdominal pains, nausea, vomiting, diarrhea, lower extremity swelling, rash.  Past Medical History  Diagnosis Date  . Hypertension   . Hypercholesteremia   . GERD (gastroesophageal reflux disease)   . Schatzki's ring     a. s/p dilatations in the past.  . Chest pain   . Palpitations     a. seen by cardiology in [place] and apparently wore a monitor - unrevealing (2012).  . Obesity    Past Surgical History  Procedure Laterality Date   . Rotator cuff repair    . Left heart catheterization with coronary angiogram N/A 02/15/2013    Procedure: LEFT HEART CATHETERIZATION WITH CORONARY ANGIOGRAM;  Surgeon: Thayer Headings, MD;  Location: [facility] CATH LAB;  Service: Cardiovascular;  Laterality: N/A;   Family History  Problem Relation Age of Onset  . COPD Mother    History  Substance Use Topics  . Smoking status: Never Smoker   . Smokeless tobacco: Not on file  . Alcohol Use: Yes     Comment: "social drinking"    Review of Systems  10 Systems reviewed and are negative for acute change except as noted in the HPI.    Allergies  Review of patient's allergies indicates no known allergies.  Home Medications   Prior to Admission medications   Medication Sig Start Date End Date Taking? Authorizing Provider  amLODipine (NORVASC) 5 MG tablet Take 1 tablet (5 mg total) by mouth daily. 02/17/13  Yes Samella Parr, NP  aspirin EC 81 MG tablet Take 81 mg by mouth daily.   Yes Historical Provider, MD  lansoprazole (PREVACID) 30 MG capsule Take 1 capsule (30 mg total) by mouth daily. 02/16/13  Yes Samella Parr, NP  lisinopril (PRINIVIL,ZESTRIL) 40 MG tablet Take 1 tablet (40 mg total) by mouth daily. 02/17/13  Yes Samella Parr, NP  lovastatin (MEVACOR) 20 MG tablet Take 20 mg by mouth daily. 02/09/15  Yes Historical Provider, MD  Multiple Vitamins-Minerals (MULTIVITAMIN & MINERAL PO) Take 1 tablet by mouth  daily.   Yes Historical Provider, MD   BP 138/97 mmHg  Pulse 71  Temp(Src) 98 F (36.7 C)  Resp 17  SpO2 99% Physical Exam  Constitutional: He appears well-developed and well-nourished. No distress.  HENT:  Head: Normocephalic and atraumatic.  Eyes: Pupils are equal, round, and reactive to light.  Neck: Normal range of motion. Neck supple.  Cardiovascular: Normal rate and regular rhythm.   Pulmonary/Chest: Effort normal and breath sounds normal. He has no wheezes. He has no rhonchi.  Abdominal: Soft. There is no tenderness.  There is no rebound.  Musculoskeletal:  No lower extremity swelling  Neurological: He is alert.  Skin: Skin is warm and dry.  Nursing note and vitals reviewed.   ED Course  Procedures (including critical care time) Labs Review Labs Reviewed  BASIC METABOLIC PANEL - Abnormal; Notable for the following:    Glucose, Bld 107 (*)    All other components within normal limits  CBC  PROTIME-INR  I-STAT TROPOININ, ED  Randolm Idol, ED    Imaging Review Dg Chest 2 View  03/06/2015   CLINICAL DATA:  Chest pain and shortness of Breath for 1 day  EXAM: CHEST - 2 VIEW  COMPARISON:  04/13/2014  FINDINGS: The heart size and mediastinal contours are within normal limits. Both lungs are clear. The visualized skeletal structures are unremarkable.  IMPRESSION: No active disease.   Electronically Signed   By: Inez Catalina M.D.   On: 03/06/2015 12:27   Ct Angio Chest Pe W/cm &/or Wo Cm  03/06/2015   CLINICAL DATA:  Chest pain and short of breath.  Dizziness.  EXAM: CT ANGIOGRAPHY CHEST WITH CONTRAST  TECHNIQUE: Multidetector CT imaging of the chest was performed using the standard protocol during bolus administration of intravenous contrast. Multiplanar CT image reconstructions and MIPs were obtained to evaluate the vascular anatomy.  CONTRAST:  80mL OMNIPAQUE IOHEXOL 350 MG/ML SOLN  COMPARISON:  Chest x-ray 03/06/2015  FINDINGS: Negative for pulmonary embolism. Negative for aortic aneurysm or dissection. Heart size normal. No pericardial effusion. No significant coronary calcification.  Negative for infiltrate or effusion. Lungs are clear. No lung nodule. Negative for mass or adenopathy.  Negative upper abdomen.  No acute skeletal abnormality.  Review of the MIP images confirms the above findings.  IMPRESSION: Negative for pulmonary embolism.  No acute abnormality in the chest.   Electronically Signed   By: Franchot Gallo M.D.   On: 03/06/2015 15:02     EKG Interpretation   Date/Time:  Monday March 06 2015 11:45:09 EDT Ventricular Rate:  95 PR Interval:  188 QRS Duration: 78 QT Interval:  338 QTC Calculation: 424 R Axis:   91 Text Interpretation:  Normal sinus rhythm Rightward axis Low voltage QRS  similar previous Confirmed by ZAVITZ  MD, JOSHUA ([id number]) on 03/06/2015  1:41:52 PM      MDM   Final diagnoses:  Shortness of breath  Dizziness  Chest pain   Negative Troponin x 2 in the ED Neg orthostatics Negative CT angio of the chest, no PE or any other acute abnormalities.  EKG un-remarkble for acute changes. Dr. Reather Converse has seen patient as well, given hx of cardiac cath in 2014 that was completely normal we feel he is safe for discharge at this time. Dr. Reather Converse discussed his results with him. He can f/u with his PCP and GI doctor.  Medications  sodium chloride 0.9 % bolus 1,000 mL (0 mLs Intravenous Stopped 03/06/15 1453)  morphine 4 MG/ML  injection 4 mg (4 mg Intravenous Given 03/06/15 1419)  ondansetron (ZOFRAN) injection 4 mg (4 mg Intravenous Given 03/06/15 1419)  iohexol (OMNIPAQUE) 350 MG/ML injection 75 mL (75 mLs Intravenous Contrast Given 03/06/15 1428)   44 y.o.Elyn Aquas Wachs's evaluation in the Emergency Department is complete. It has been determined that no acute conditions requiring further emergency intervention are present at this time. The patient/guardian have been advised of the diagnosis and plan. We have discussed signs and symptoms that warrant return to the ED, such as changes or worsening in symptoms.  Vital signs are stable at discharge. Filed Vitals:   03/06/15 1530  BP: 138/97  Pulse: 71  Temp:   Resp: 17    Patient/guardian has voiced understanding and agreed to follow-up with the PCP or specialist.     Delos Haring, PA-C 03/06/15 1545  Elnora Morrison, MD 03/06/15 1651

## 2015-03-06 NOTE — ED Notes (Signed)
Patient transported to CT 

## 2016-03-14 ENCOUNTER — Ambulatory Visit: Payer: BC Managed Care – PPO | Admitting: Primary Care

## 2016-04-04 ENCOUNTER — Ambulatory Visit: Payer: BC Managed Care – PPO | Admitting: Primary Care

## 2016-04-09 ENCOUNTER — Ambulatory Visit: Payer: BC Managed Care – PPO | Admitting: Primary Care

## 2016-04-09 ENCOUNTER — Telehealth: Payer: Self-pay | Admitting: Primary Care

## 2016-04-09 DIAGNOSIS — Z0289 Encounter for other administrative examinations: Secondary | ICD-10-CM

## 2016-04-09 NOTE — Telephone Encounter (Signed)
Patient did not come in for their appointment today for new pt appt.  Please let me know if patient needs to be contacted immediately for follow up or no follow up needed. °

## 2016-04-09 NOTE — Telephone Encounter (Signed)
No immediate contact needed.

## 2016-06-24 ENCOUNTER — Encounter (HOSPITAL_COMMUNITY): Payer: Self-pay

## 2016-06-24 DIAGNOSIS — Z7982 Long term (current) use of aspirin: Secondary | ICD-10-CM | POA: Insufficient documentation

## 2016-06-24 DIAGNOSIS — Z79899 Other long term (current) drug therapy: Secondary | ICD-10-CM | POA: Insufficient documentation

## 2016-06-24 DIAGNOSIS — R310 Gross hematuria: Secondary | ICD-10-CM | POA: Insufficient documentation

## 2016-06-24 DIAGNOSIS — I10 Essential (primary) hypertension: Secondary | ICD-10-CM | POA: Insufficient documentation

## 2016-06-24 LAB — URINALYSIS, ROUTINE W REFLEX MICROSCOPIC
BILIRUBIN URINE: NEGATIVE
Glucose, UA: NEGATIVE mg/dL
Ketones, ur: NEGATIVE mg/dL
Leukocytes, UA: NEGATIVE
Nitrite: NEGATIVE
Protein, ur: 100 mg/dL — AB
SPECIFIC GRAVITY, URINE: 1.019 (ref 1.005–1.030)
pH: 6 (ref 5.0–8.0)

## 2016-06-24 LAB — URINE MICROSCOPIC-ADD ON: WBC UA: NONE SEEN WBC/hpf (ref 0–5)

## 2016-06-24 NOTE — ED Triage Notes (Signed)
Pt states he started having blood in urine on saturday with discomfort starting today; [facility] seen at [facility]; pt was found to be hypertensive; pt hypertensive at triage; pt states swollen testicles with left having discomfort; pt c/o pain at 5-6  In lower abdominal area; pt a&ox 4 on arrival. Pt states hx of HTN but is non compliant with meds

## 2016-06-25 ENCOUNTER — Emergency Department (HOSPITAL_COMMUNITY): Payer: Self-pay

## 2016-06-25 ENCOUNTER — Emergency Department (HOSPITAL_COMMUNITY)
Admission: EM | Admit: 2016-06-25 | Discharge: 2016-06-25 | Disposition: A | Discharge status: Home / Self Care | Payer: Self-pay | Attending: Emergency Medicine | Admitting: Emergency Medicine

## 2016-06-25 DIAGNOSIS — R310 Gross hematuria: Secondary | ICD-10-CM

## 2016-06-25 DIAGNOSIS — I10 Essential (primary) hypertension: Secondary | ICD-10-CM

## 2016-06-25 DIAGNOSIS — R52 Pain, unspecified: Secondary | ICD-10-CM

## 2016-06-25 LAB — CBC WITH DIFFERENTIAL/PLATELET
BASOS PCT: 1 %
Basophils Absolute: 0.1 10*3/uL (ref 0.0–0.1)
Eosinophils Absolute: 0.5 10*3/uL (ref 0.0–0.7)
Eosinophils Relative: 6 %
HEMATOCRIT: 47.8 % (ref 39.0–52.0)
Hemoglobin: 16.9 g/dL (ref 13.0–17.0)
LYMPHS ABS: 2.9 10*3/uL (ref 0.7–4.0)
Lymphocytes Relative: 35 %
MCH: 31.6 pg (ref 26.0–34.0)
MCHC: 35.4 g/dL (ref 30.0–36.0)
MCV: 89.5 fL (ref 78.0–100.0)
MONO ABS: 0.7 10*3/uL (ref 0.1–1.0)
MONOS PCT: 8 %
NEUTROS ABS: 4.1 10*3/uL (ref 1.7–7.7)
Neutrophils Relative %: 50 %
Platelets: 184 10*3/uL (ref 150–400)
RBC: 5.34 MIL/uL (ref 4.22–5.81)
RDW: 12.8 % (ref 11.5–15.5)
WBC: 8.2 10*3/uL (ref 4.0–10.5)

## 2016-06-25 LAB — BASIC METABOLIC PANEL
Anion gap: 10 (ref 5–15)
BUN: 11 mg/dL (ref 6–20)
CALCIUM: 9.3 mg/dL (ref 8.9–10.3)
CO2: 25 mmol/L (ref 22–32)
CREATININE: 1.16 mg/dL (ref 0.61–1.24)
Chloride: 102 mmol/L (ref 101–111)
GFR calc Af Amer: >60 mL/min (ref >60)
GFR calc non Af Amer: >60 mL/min (ref >60)
Glucose, Bld: 99 mg/dL (ref 65–99)
Potassium: 3.3 mmol/L — ABNORMAL LOW (ref 3.5–5.1)
Sodium: 137 mmol/L (ref 135–145)

## 2016-06-25 MED ORDER — LISINOPRIL 20 MG PO TABS
40.0000 mg | ORAL_TABLET | Freq: Once | ORAL | Status: AC
  Administered 2016-06-25: 40 mg via ORAL
  Filled 2016-06-25: qty 2

## 2016-06-25 MED ORDER — LOVASTATIN 20 MG PO TABS: 20.0000 mg | ORAL_TABLET | Freq: Every day | ORAL | 2 refills | Status: AC

## 2016-06-25 MED ORDER — KETOROLAC TROMETHAMINE 30 MG/ML IJ SOLN
30.0000 mg | Freq: Once | INTRAMUSCULAR | Status: AC
  Administered 2016-06-25: 30 mg via INTRAVENOUS
  Filled 2016-06-25: qty 1

## 2016-06-25 MED ORDER — AMLODIPINE BESYLATE 5 MG PO TABS: 5.0000 mg | ORAL_TABLET | Freq: Every day | ORAL | 2 refills | Status: DC

## 2016-06-25 MED ORDER — SODIUM CHLORIDE 0.9 % IV BOLUS (SEPSIS)
1000.0000 mL | Freq: Once | INTRAVENOUS | Status: AC
  Administered 2016-06-25: 1000 mL via INTRAVENOUS

## 2016-06-25 MED ORDER — AMLODIPINE BESYLATE 5 MG PO TABS
5.0000 mg | ORAL_TABLET | Freq: Once | ORAL | Status: AC
  Administered 2016-06-25: 5 mg via ORAL
  Filled 2016-06-25: qty 1

## 2016-06-25 MED ORDER — LISINOPRIL 40 MG PO TABS: 40.0000 mg | ORAL_TABLET | Freq: Every day | ORAL | 2 refills | Status: AC

## 2016-06-25 NOTE — ED Notes (Signed)
Patient transported to Ultrasound 

## 2016-06-25 NOTE — ED Provider Notes (Signed)
By signing my name below, I, Vincent Hicks, attest that this documentation has been prepared under the direction and in the presence of physician practitioner, Delice Bison Ward, DO. Electronically Signed: Dora Hicks, Scribe. 06/25/2016. 12:44 AM.  TIME SEEN: 12:44 AM  CHIEF COMPLAINT: Hematuria, Testicular Swelling  HPI: HPI Comments: Vincent Hicks is a 45 y.o. male who presents to the Emergency Department complaining of sudden onset, intermittent, hematuria beginning 3 days ago. He reports left testicular pain and swelling as well as some intermittent nausea and chills beginning yesterday; he notes his nausea has resolved. He reports his left testicular pain/swelling worsened at work yesterday. He endorses pain exacerbation with palpation to his left testicle and with walking. Pt went to Urgent Care yesterday and was told he had elevated protein in his urine. Pt notes he has been off of his blood pressure medications for about 3 months because he has felt fine and felt like he did not need to take them; he is supposed to use Lisinopril 40 mg and Amlodipine 5 mg. He does not use blood thinners. He denies h/o abdominal surgery, STD, penile discharge, kidney stones, kidney cancer, or bladder cancer. He further denies vomiting, diarrhea, dysuria, headache, vision changes, CP, SOB, numbness, weakness, penile discharge, back pain, or any other associated symptoms.   ROS: See HPI Constitutional: no fever  Eyes: no drainage  ENT: no runny nose   Cardiovascular:  no chest pain  Resp: no SOB  GI: no vomiting GU: no dysuria Integumentary: no rash  Allergy: no hives  Musculoskeletal: no leg swelling  Neurological: no slurred speech ROS otherwise negative  PAST MEDICAL HISTORY/PAST SURGICAL HISTORY:  Past Medical History:  Diagnosis Date  . Chest pain   . GERD (gastroesophageal reflux disease)   . Hypercholesteremia   . Hypertension   . Obesity   . Palpitations    a. seen by cardiology in  [place] and apparently wore a monitor - unrevealing (2012).  . Schatzki's ring    a. s/p dilatations in the past.    MEDICATIONS:  Prior to Admission medications   Medication Sig Start Date End Date Taking? Authorizing Provider  amLODipine (NORVASC) 5 MG tablet Take 1 tablet (5 mg total) by mouth daily. 02/17/13   Samella Parr, NP  aspirin EC 81 MG tablet Take 81 mg by mouth daily.    Historical Provider, MD  lansoprazole (PREVACID) 30 MG capsule Take 1 capsule (30 mg total) by mouth daily. 02/16/13   Samella Parr, NP  lisinopril (PRINIVIL,ZESTRIL) 40 MG tablet Take 1 tablet (40 mg total) by mouth daily. 02/17/13   Samella Parr, NP  lovastatin (MEVACOR) 20 MG tablet Take 20 mg by mouth daily. 02/09/15   Historical Provider, MD  Multiple Vitamins-Minerals (MULTIVITAMIN & MINERAL PO) Take 1 tablet by mouth daily.    Historical Provider, MD    ALLERGIES:  No Known Allergies  SOCIAL HISTORY:  Social History  Substance Use Topics  . Smoking status: Never Smoker  . Smokeless tobacco: Not on file  . Alcohol use Yes     Comment: "social drinking"    FAMILY HISTORY: Family History  Problem Relation Age of Onset  . COPD Mother     EXAM: BP (!) 195/124 (BP Location: Left Arm) Comment: RN notified   Pulse 96   Temp 99 F (37.2 C) (Oral)   Resp 16   Ht 6' (1.829 m)   Wt 295 lb (133.8 kg)   SpO2 98%  BMI 40.01 kg/m  CONSTITUTIONAL: Alert and oriented and responds appropriately to questions. Well-appearing; well-nourished HEAD: Normocephalic EYES: Conjunctivae clear, PERRL, EOMI ENT: normal nose; no rhinorrhea; moist mucous membranes NECK: Supple, no meningismus, no nuchal rigidity, no LAD  CARD: RRR; S1 and S2 appreciated; no murmurs, no clicks, no rubs, no gallops RESP: Normal chest excursion without splinting or tachypnea; breath sounds clear and equal bilaterally; no wheezes, no rhonchi, no rales, no hypoxia or respiratory distress, speaking full sentences ABD/GI:  Normal bowel sounds; non-distended; soft, non-tender, no rebound, no guarding, no peritoneal signs, no hepatosplenomegaly GU:  Normal external genitalia, circumcised male, normal penile shaft, no blood or discharge at the urethral meatus, no testicular masses or tenderness on exam, no scrotal masses or swelling, no hernias appreciated, 2+ femoral pulses bilaterally; no perineal erythema, warmth, subcutaneous air or crepitus; no high riding testicle, normal bilateral cremasteric reflex.  Chaperone present for exam. BACK:  The back appears normal and is non-tender to palpation, there is no CVA tenderness EXT: Normal ROM in all joints; non-tender to palpation; no edema; normal capillary refill; no cyanosis, no calf tenderness or swelling    SKIN: Normal color for age and race; warm; no rash NEURO: Moves all extremities equally, sensation to light touch intact diffusely, cranial nerves II through XII intact, normal speech PSYCH: The patient's mood and manner are appropriate. Grooming and personal hygiene are appropriate.  MEDICAL DECISION MAKING: Patient here complains of gross hematuria the past several days. Also complaining of left testicular pain. His exam however is completely benign. No testicular masses, scrotal swelling or testicular pain on exam. No sign of a high-grade testicle and he has normal bilateral cremasteric reflexes. Doubt torsion. Denies history of kidney stone, renal or bladder cancer. Urinalysis showed blood with no other sign of infection but we have sent a urine culture. We'll obtain a CT scan to evaluate for possible stone, mass, cancerous lesion. We'll also obtain an ultrasound of his testicle.   Patient is also hypertensive. He denies chest pain, shortness of breath, neurologic deficit, headache, vision changes. We will restart his home blood pressure medications. We'll give him refills of these medicines as well.  ED PROGRESS: Patient's labs have been unremarkable. Urine shows  small amount of protein and gross hematuria. Creatinine is normal. Still neurologically intact. Blood pressure improving after oral medications. Ultrasound of his testicles are unremarkable with good flow to both. No epididymitis, masses, torsion. CT of his abdomen and pelvis shows mild nonspecific bilateral perinephric stranding but no kidney stones, kidney lesions, bladder masses. I feel he is safe to be discharged home with close outpatient urology follow-up. At this time I do not feel he needs to be on antibiotics as again he doesn't have any sign of urinary tract infection other than blood. He has been able to urinate without difficulty. He is not passing any clots. Discussed with him at length return precautions. He verbalizes understanding and is comfortable with this plan.   At this time, I do not feel there is any life-threatening condition present. I have reviewed and discussed all results (EKG, imaging, lab, urine as appropriate) and exam findings with patient/family. I have reviewed nursing notes and appropriate previous records.  I feel the patient is safe to be discharged home without further emergent workup and can continue workup as an outpatient as needed. Discussed usual and customary return precautions. Patient/family verbalize understanding and are comfortable with this plan.  Outpatient follow-up has been provided. All questions have been answered.  I personally performed the services described in this documentation, which was scribed in my presence. The recorded information has been reviewed and is accurate.    [place], DO 06/25/16 [phone]

## 2016-06-25 NOTE — Discharge Instructions (Signed)
To find a primary care or specialty doctor please call [phone] or [phone] to access "[facility] Find a Doctor Service." ° °You may also go on the [facility] website at [email or web address] ° °There are also multiple [facility], [facility], [facility] and [facility] practices throughout the [place] that are frequently accepting new patients. You may find a clinic that is close to your home and contact them. ° °[facility] -  °[address] °[place] [address] °[phone] ° ° °[facility] -  °[address] °[place] [address] °[phone] ° ° °[facility] - °[address]  °[place] [address] °[phone] ° ° °

## 2016-06-26 LAB — URINE CULTURE: Culture: NO GROWTH

## 2017-03-25 ENCOUNTER — Ambulatory Visit: Payer: BC Managed Care – PPO | Admitting: Internal Medicine

## 2017-04-10 ENCOUNTER — Ambulatory Visit: Payer: Self-pay | Admitting: Internal Medicine

## 2017-04-10 ENCOUNTER — Ambulatory Visit: Payer: 59 | Admitting: Internal Medicine

## 2017-07-22 ENCOUNTER — Ambulatory Visit: Payer: 59 | Admitting: Internal Medicine

## 2017-07-29 ENCOUNTER — Ambulatory Visit: Payer: 59 | Admitting: Internal Medicine

## 2019-08-16 ENCOUNTER — Other Ambulatory Visit: Payer: Self-pay

## 2019-08-16 ENCOUNTER — Encounter: Payer: Self-pay | Admitting: *Deleted

## 2019-08-16 ENCOUNTER — Encounter: Payer: No Typology Code available for payment source | Attending: Student | Admitting: *Deleted

## 2019-08-16 VITALS — BP 142/86 | Ht 71.0 in | Wt 331.6 lb

## 2019-08-16 DIAGNOSIS — E782 Mixed hyperlipidemia: Secondary | ICD-10-CM | POA: Insufficient documentation

## 2019-08-16 DIAGNOSIS — E1165 Type 2 diabetes mellitus with hyperglycemia: Secondary | ICD-10-CM | POA: Insufficient documentation

## 2019-08-16 DIAGNOSIS — I10 Essential (primary) hypertension: Secondary | ICD-10-CM | POA: Diagnosis not present

## 2019-08-16 DIAGNOSIS — Z713 Dietary counseling and surveillance: Secondary | ICD-10-CM | POA: Insufficient documentation

## 2019-08-16 DIAGNOSIS — E119 Type 2 diabetes mellitus without complications: Secondary | ICD-10-CM

## 2019-08-16 NOTE — Patient Instructions (Signed)
Exercise: Continue program at gym for  45-60 minutes 4-6 days a week  Eat 3 meals day,   1-2 snacks a day Space meals 4-6 hours apart Don't skip meals Avoid sugar sweetened drinks (tea)  Call back if you want to attend classes or make an additional appointment with the nurse or dietitian

## 2019-08-16 NOTE — Progress Notes (Signed)
Diabetes Self-Management Education  Visit Type: First/Initial  Appt. Start Time: 1515 Appt. End Time: 1620  08/16/2019  Mr. Vincent Hicks, identified by name and date of birth, is a 49 y.o. male with a diagnosis of Diabetes: Type 2.   ASSESSMENT  Blood pressure (!) 142/86, height 5\' 11"  (1.803 m), weight (!) 331 lb 9.6 oz (150.4 kg). Body mass index is 46.25 kg/m.  Diabetes Self-Management Education - 08/16/19 1654      Visit Information   Visit Type  First/Initial      Initial Visit   Diabetes Type  Type 2    Are you currently following a meal plan?  No    Are you taking your medications as prescribed?  Yes    Date Diagnosed  Nov 2020 - pt reports he wasn't told he had diabetes      Health Coping   How would you rate your overall health?  Fair      Psychosocial Assessment   Patient Belief/Attitude about Diabetes  Motivated to manage diabetes    Self-care barriers  None    Self-management support  Family    Patient Concerns  Nutrition/Meal planning;Glycemic Control;Weight Control;Medication;Healthy Lifestyle    Special Needs  None    Preferred Learning Style  Auditory;Visual    Learning Readiness  Change in progress    How often do you need to have someone help you when you read instructions, pamphlets, or other written materials from your doctor or pharmacy?  1 - Never    What is the last grade level you completed in school?  some college      Pre-Education Assessment   Patient understands the diabetes disease and treatment process.  Needs Instruction    Patient understands incorporating nutritional management into lifestyle.  Needs Instruction    Patient undertands incorporating physical activity into lifestyle.  Needs Review    Patient understands using medications safely.  Needs Instruction    Patient understands monitoring blood glucose, interpreting and using results  Needs Instruction    Patient understands prevention, detection, and treatment of acute  complications.  Needs Instruction    Patient understands prevention, detection, and treatment of chronic complications.  Needs Instruction    Patient understands how to develop strategies to address psychosocial issues.  Needs Instruction    Patient understands how to develop strategies to promote health/change behavior.  Needs Instruction      Complications   Last HgB A1C per patient/outside source  6.5 %   06/14/2019   How often do you check your blood sugar?  Not recommended by provider    Have you had a dilated eye exam in the past 12 months?  Yes    Have you had a dental exam in the past 12 months?  Yes    Are you checking your feet?  No      Dietary Intake   Breakfast  skips    Snack (morning)  0-1 snacks - trail mix, dried fruit or fruit like melon, grapes, apples, bananas    Lunch  left-overs or salads with lettuce, cuccumbers, tomatoes, celery, egg, chese, cottage cheese; [place] or chicken sandwich on sandwich thins    Snack (afternoon)  protein shake after going to gym before supper (powder in water or smoothie with powder, yogurt, milk, frozen fruit)    Dinner  beef, chicken, shrimp, occasional bread and potatoes, peas, green beans, white beans, occasional rice, pasta weekly; broccoli, cauliflower    Beverage(s)  water, sweet tea  with 1 meal/day      Exercise   Exercise Type  Moderate (swimming / aerobic walking)   gym   How many days per week to you exercise?  5    How many minutes per day do you exercise?  50    Total minutes per week of exercise  250      Patient Education   Previous Diabetes Education  No    Disease state   Definition of diabetes, type 1 and 2, and the diagnosis of diabetes;Factors that contribute to the development of diabetes    Nutrition management   Role of diet in the treatment of diabetes and the relationship between the three main macronutrients and blood glucose level;Food label reading, portion sizes and measuring food.;Carbohydrate  counting;Effects of alcohol on blood glucose and safety factors with consumption of alcohol.    Physical activity and exercise   Role of exercise on diabetes management, blood pressure control and cardiac health.    Monitoring  Purpose and frequency of SMBG.;Taught/discussed recording of test results and interpretation of SMBG.;Identified appropriate SMBG and/or A1C goals.    Chronic complications  Relationship between chronic complications and blood glucose control    Psychosocial adjustment  Identified and addressed patients feelings and concerns about diabetes      Individualized Goals (developed by patient)   Reducing Risk  Improve blood sugars Decrease medications Lose weight Lead a healthier lifestyle Become more fit     Outcomes   Expected Outcomes  Demonstrated interest in learning. Expect positive outcomes    Future DMSE  PRN    Program Status  Not Completed       Individualized Plan for Diabetes Self-Management Training:   Learning Objective:  Patient will have a greater understanding of diabetes self-management. Patient education plan is to attend individual and/or group sessions per assessed needs and concerns.   Plan:   Patient Instructions  Exercise: Continue program at gym for  45-60 minutes 4-6 days a week Eat 3 meals day,   1-2 snacks a day Space meals 4-6 hours apart Don't skip meals Avoid sugar sweetened drinks (tea) Call back if you want to attend classes or make an additional appointment with the nurse or dietitian  Expected Outcomes:  Demonstrated interest in learning. Expect positive outcomes  Education material provided:  General Meal Planning Guidelines Simple Meal Plan  If problems or questions, patient to contact team via:  Vincent Drilling, RN, CCM, CDE [phone]  Future DSME appointment: PRN  Patient wants to work on meal changes and call back later if he needs to schedule further education visits.

## 2021-07-12 ENCOUNTER — Encounter: Payer: Self-pay | Admitting: Gastroenterology

## 2021-07-13 ENCOUNTER — Ambulatory Visit
Admission: RE | Admit: 2021-07-13 | Discharge: 2021-07-13 | Disposition: A | Discharge status: Home / Self Care | Payer: 59 | Attending: Gastroenterology | Admitting: Gastroenterology

## 2021-07-13 ENCOUNTER — Ambulatory Visit: Payer: 59 | Admitting: Anesthesiology

## 2021-07-13 ENCOUNTER — Encounter
Admission: RE | Disposition: A | Discharge status: Home / Self Care | Payer: Self-pay | Source: Home / Self Care | Attending: Gastroenterology

## 2021-07-13 ENCOUNTER — Encounter: Payer: Self-pay | Admitting: Gastroenterology

## 2021-07-13 DIAGNOSIS — D120 Benign neoplasm of cecum: Secondary | ICD-10-CM | POA: Diagnosis not present

## 2021-07-13 DIAGNOSIS — Z1211 Encounter for screening for malignant neoplasm of colon: Secondary | ICD-10-CM | POA: Insufficient documentation

## 2021-07-13 DIAGNOSIS — E7800 Pure hypercholesterolemia, unspecified: Secondary | ICD-10-CM | POA: Diagnosis not present

## 2021-07-13 DIAGNOSIS — D123 Benign neoplasm of transverse colon: Secondary | ICD-10-CM | POA: Insufficient documentation

## 2021-07-13 DIAGNOSIS — Z6841 Body Mass Index (BMI) 40.0 and over, adult: Secondary | ICD-10-CM | POA: Insufficient documentation

## 2021-07-13 DIAGNOSIS — G4730 Sleep apnea, unspecified: Secondary | ICD-10-CM | POA: Diagnosis not present

## 2021-07-13 DIAGNOSIS — K5730 Diverticulosis of large intestine without perforation or abscess without bleeding: Secondary | ICD-10-CM | POA: Diagnosis not present

## 2021-07-13 DIAGNOSIS — E119 Type 2 diabetes mellitus without complications: Secondary | ICD-10-CM | POA: Insufficient documentation

## 2021-07-13 DIAGNOSIS — Z87891 Personal history of nicotine dependence: Secondary | ICD-10-CM | POA: Insufficient documentation

## 2021-07-13 DIAGNOSIS — K640 First degree hemorrhoids: Secondary | ICD-10-CM | POA: Diagnosis not present

## 2021-07-13 DIAGNOSIS — Z79899 Other long term (current) drug therapy: Secondary | ICD-10-CM | POA: Diagnosis not present

## 2021-07-13 DIAGNOSIS — E669 Obesity, unspecified: Secondary | ICD-10-CM | POA: Insufficient documentation

## 2021-07-13 DIAGNOSIS — I10 Essential (primary) hypertension: Secondary | ICD-10-CM | POA: Insufficient documentation

## 2021-07-13 HISTORY — DX: Allergy status to unspecified drugs, medicaments and biological substances: Z88.9

## 2021-07-13 HISTORY — DX: Sleep apnea, unspecified: G47.30

## 2021-07-13 HISTORY — PX: COLONOSCOPY: SHX5424

## 2021-07-13 LAB — GLUCOSE, CAPILLARY: Glucose-Capillary: 132 mg/dL — ABNORMAL HIGH (ref 70–99)

## 2021-07-13 SURGERY — COLONOSCOPY
Anesthesia: General

## 2021-07-13 MED ORDER — SODIUM CHLORIDE 0.9 % IV SOLN
INTRAVENOUS | Status: DC
  Administered 2021-07-13: 20 mL/h via INTRAVENOUS

## 2021-07-13 MED ORDER — LIDOCAINE HCL (CARDIAC) PF 100 MG/5ML IV SOSY
PREFILLED_SYRINGE | INTRAVENOUS | Status: DC | PRN
  Administered 2021-07-13: 60 mg via INTRAVENOUS

## 2021-07-13 MED ORDER — PROPOFOL 500 MG/50ML IV EMUL
INTRAVENOUS | Status: AC
  Filled 2021-07-13: qty 50

## 2021-07-13 MED ORDER — PHENYLEPHRINE HCL (PRESSORS) 10 MG/ML IV SOLN
INTRAVENOUS | Status: AC
  Filled 2021-07-13: qty 1

## 2021-07-13 MED ORDER — PROPOFOL 10 MG/ML IV BOLUS
INTRAVENOUS | Status: DC | PRN
  Administered 2021-07-13: 100 mg via INTRAVENOUS

## 2021-07-13 MED ORDER — PROPOFOL 500 MG/50ML IV EMUL
INTRAVENOUS | Status: DC | PRN
  Administered 2021-07-13: 150 ug/kg/min via INTRAVENOUS

## 2021-07-13 MED ORDER — LIDOCAINE HCL (PF) 2 % IJ SOLN
INTRAMUSCULAR | Status: AC
  Filled 2021-07-13: qty 5

## 2021-07-13 MED ORDER — DEXMEDETOMIDINE (PRECEDEX) IN NS 20 MCG/5ML (4 MCG/ML) IV SYRINGE
PREFILLED_SYRINGE | INTRAVENOUS | Status: DC | PRN
  Administered 2021-07-13: 20 ug via INTRAVENOUS

## 2021-07-13 NOTE — Anesthesia Preprocedure Evaluation (Signed)
Anesthesia Evaluation  Patient identified by MRN, date of birth, ID band Patient awake    Reviewed: Allergy & Precautions, NPO status , Patient's Chart, lab work & pertinent test results  Airway Mallampati: III  TM Distance: >3 FB Neck ROM: Full    Dental  (+) Teeth Intact, Partial Upper   Pulmonary neg pulmonary ROS, sleep apnea and Continuous Positive Airway Pressure Ventilation , former smoker,    Pulmonary exam normal  + decreased breath sounds      Cardiovascular Exercise Tolerance: Good hypertension, Pt. on medications negative cardio ROS Normal cardiovascular exam Rhythm:Regular Rate:Normal     Neuro/Psych negative neurological ROS  negative psych ROS   GI/Hepatic negative GI ROS, Neg liver ROS, GERD  ,  Endo/Other  negative endocrine ROSdiabetes, Well Controlled, Type 2  Renal/GU negative Renal ROS  negative genitourinary   Musculoskeletal   Abdominal (+) + obese,   Peds negative pediatric ROS (+)  Hematology negative hematology ROS (+)   Anesthesia Other Findings Past Medical History: No date: Allergic genetic state No date: Chest pain No date: Diabetes mellitus without complication (HCC) No date: GERD (gastroesophageal reflux disease) No date: Hypercholesteremia No date: Hypertension No date: Obesity No date: Palpitations     Comment:  a. seen by cardiology in [place] and apparently wore               a monitor - unrevealing (2012). No date: Schatzki's ring     Comment:  a. s/p dilatations in the past. No date: Sleep apnea  Past Surgical History: 02/15/2013: LEFT HEART CATHETERIZATION WITH CORONARY ANGIOGRAM; N/A     Comment:  Procedure: LEFT HEART CATHETERIZATION WITH CORONARY               ANGIOGRAM;  Surgeon: Thayer Headings, MD;  [facility]               CATH LAB;  Service: Cardiovascular;  Laterality: N/A; No date: ROTATOR CUFF REPAIR  BMI    Body Mass Index: 43.24 kg/m       Reproductive/Obstetrics negative OB ROS                             Anesthesia Physical Anesthesia Plan  ASA: 3  Anesthesia Plan: General   Post-op Pain Management:    Induction: Intravenous  PONV Risk Score and Plan: Propofol infusion and TIVA  Airway Management Planned: Natural Airway and Nasal Cannula  Additional Equipment:   Intra-op Plan:   Post-operative Plan:   Informed Consent: I have reviewed the patients History and Physical, chart, labs and discussed the procedure including the risks, benefits and alternatives for the proposed anesthesia with the patient or authorized representative who has indicated his/her understanding and acceptance.     Dental Advisory Given  Plan Discussed with: CRNA and Surgeon  Anesthesia Plan Comments:         Anesthesia Quick Evaluation

## 2021-07-13 NOTE — Transfer of Care (Signed)
Immediate Anesthesia Transfer of Care Note  Patient: Vincent Hicks  Procedure(s) Performed: Procedure(s): COLONOSCOPY (N/A)  Patient Location: PACU and Endoscopy Unit  Anesthesia Type:General  Level of Consciousness: sedated  Airway & Oxygen Therapy: Patient Spontanous Breathing and Patient connected to nasal cannula oxygen  Post-op Assessment: Report given to RN and Post -op Vital signs reviewed and stable  Post vital signs: Reviewed and stable  Last Vitals:  Vitals:   07/13/21 0649 07/13/21 0823  BP: (!) 166/108 111/76  Pulse: 96 89  Resp: 20 18  Temp: (!) 36.2 C   SpO2:  16%    Complications: No apparent anesthesia complications

## 2021-07-13 NOTE — Interval H&P Note (Signed)
History and Physical Interval Note: Preprocedure H&P from 07/13/21  was reviewed and there was no interval change after seeing and examining the patient.  Written consent was obtained from the patient after discussion of risks, benefits, and alternatives. Patient has consented to proceed with Colonoscopy with possible intervention   07/13/2021 7:31 AM  Vincent Hicks  has presented today for surgery, with the diagnosis of Colon cancer screening (Z12.11).  The various methods of treatment have been discussed with the patient and family. After consideration of risks, benefits and other options for treatment, the patient has consented to  Procedure(s): COLONOSCOPY (N/A) as a surgical intervention.  The patient's history has been reviewed, patient examined, no change in status, stable for surgery.  I have reviewed the patient's chart and labs.  Questions were answered to the patient's satisfaction.     Annamaria Helling

## 2021-07-13 NOTE — Op Note (Signed)
[facility] Patient Name: Vincent Hicks Procedure Date: 07/13/2021 7:32 AM MRN: [id number] Account #: [id number] Date of Birth: 1971-02-17 Admit Type: Outpatient Age: 50 Room: [facility] ENDO ROOM 1 Gender: Male Note Status: Finalized Instrument Name: Colonscope [id number] Procedure:             Colonoscopy Indications:           Screening for colorectal malignant neoplasm Providers:             Rueben Bash, DO Referring MD:          Wayland Denis (Referring MD) Medicines:             Monitored Anesthesia Care Complications:         No immediate complications. Estimated blood loss:                         Minimal. Procedure:             Pre-Anesthesia Assessment:                        - Prior to the procedure, a History and Physical was                         performed, and patient medications and allergies were                         reviewed. The patient is competent. The risks and                         benefits of the procedure and the sedation options and                         risks were discussed with the patient. All questions                         were answered and informed consent was obtained.                         Patient identification and proposed procedure were                         verified by the physician, the nurse, the anesthetist                         and the technician in the endoscopy suite. Mental                         Status Examination: alert and oriented. Airway                         Examination: normal oropharyngeal airway and neck                         mobility. Respiratory Examination: clear to                         auscultation. CV Examination: RRR, no murmurs, no S3  or S4. Prophylactic Antibiotics: The patient does not                         require prophylactic antibiotics. Prior                         Anticoagulants: The patient has taken no previous                          anticoagulant or antiplatelet agents. ASA Grade                         Assessment: III - A patient with severe systemic                         disease. After reviewing the risks and benefits, the                         patient was deemed in satisfactory condition to                         undergo the procedure. The anesthesia plan was to use                         monitored anesthesia care (MAC). Immediately prior to                         administration of medications, the patient was                         re-assessed for adequacy to receive sedatives. The                         heart rate, respiratory rate, oxygen saturations,                         blood pressure, adequacy of pulmonary ventilation, and                         response to care were monitored throughout the                         procedure. The physical status of the patient was                         re-assessed after the procedure.                        After obtaining informed consent, the colonoscope was                         passed under direct vision. Throughout the procedure,                         the patient's blood pressure, pulse, and oxygen                         saturations were monitored continuously. The  Colonoscope was introduced through the anus and                         advanced to the the terminal ileum, with                         identification of the appendiceal orifice and IC                         valve. The colonoscopy was performed without                         difficulty. The patient tolerated the procedure well.                         The quality of the bowel preparation was evaluated                         using the BBPS [facility] Bowel Preparation Scale) with                         scores of: Right Colon = 3, Transverse Colon = 3 and                         Left Colon = 3 (entire mucosa seen well with no                          residual staining, small fragments of stool or opaque                         liquid). The total BBPS score equals 9. The terminal                         ileum, ileocecal valve, appendiceal orifice, and                         rectum were photographed. Findings:      The perianal and digital rectal examinations were normal. Pertinent       negatives include normal sphincter tone.      Multiple small-mouthed diverticula were found in the left colon.       Estimated blood loss: none.      Non-bleeding internal hemorrhoids were found during retroflexion. The       hemorrhoids were Grade I (internal hemorrhoids that do not prolapse).      The terminal ileum appeared normal.      A 1 to 2 mm polyp was found in the cecum. The polyp was sessile. The       polyp was removed with a cold biopsy forceps. Resection and retrieval       were complete. Estimated blood loss was minimal.      A 1 to 2 mm polyp was found in the transverse colon. The polyp was       sessile. The polyp was removed with a cold biopsy forceps. Resection and       retrieval were complete. Estimated blood loss was minimal.      A 4 to 5 mm polyp was found in the transverse colon. The polyp was  sessile. The polyp was removed with a cold snare. Resection and       retrieval were complete. Estimated blood loss was minimal.      The exam was otherwise without abnormality on direct and retroflexion       views. Impression:            - Diverticulosis in the left colon.                        - Non-bleeding internal hemorrhoids.                        - The examined portion of the ileum was normal.                        - One 1 to 2 mm polyp in the cecum, removed with a                         cold biopsy forceps. Resected and retrieved.                        - One 1 to 2 mm polyp in the transverse colon, removed                         with a cold biopsy forceps. Resected and retrieved.                        - One 4 to 5  mm polyp in the transverse colon, removed                         with a cold snare. Resected and retrieved.                        - The examination was otherwise normal on direct and                         retroflexion views. Recommendation:        - Discharge patient to home.                        - Resume previous diet.                        - Continue present medications.                        - Await pathology results.                        - Repeat colonoscopy for surveillance based on                         pathology results.                        - Return to referring physician as previously                         scheduled. Procedure Code(s):     --- Professional ---  45385, Colonoscopy, flexible; with removal of                         tumor(s), polyp(s), or other lesion(s) by snare                         technique                        45380, 59, Colonoscopy, flexible; with biopsy, single                         or multiple Diagnosis Code(s):     --- Professional ---                        Z12.11, Encounter for screening for malignant neoplasm                         of colon                        K64.0, First degree hemorrhoids                        K63.5, Polyp of colon                        K57.30, Diverticulosis of large intestine without                         perforation or abscess without bleeding CPT copyright 2019 American Medical Association. All rights reserved. The codes documented in this report are preliminary and upon coder review may  be revised to meet current compliance requirements. Attending Participation:      I personally performed the entire procedure. Volney American, DO Annamaria Helling DO, DO 07/13/2021 8:19:05 AM This report has been signed electronically. Number of Addenda: 0 Note Initiated On: 07/13/2021 7:32 AM Scope Withdrawal Time: 0 hours 26 minutes 52 seconds  Total Procedure Duration: 0 hours 31  minutes 47 seconds  Estimated Blood Loss:  Estimated blood loss was minimal.      [facility]

## 2021-07-13 NOTE — H&P (Signed)
Pre-Procedure H&P   Patient ID: Vincent Hicks is a 50 y.o. male.  Gastroenterology Provider: Annamaria Helling, DO  Referring Provider: Laurine Blazer, PA PCP: Wayland Denis, PA-C  Date: 07/13/2021  HPI Vincent Hicks is a 50 y.o. male who presents today for Colonoscopy for screening colonoscopy.  BM normal w/o blood, melena, diarrhea, constipation.  No fhx crc or colon polyps.  Had a colonoscopy in 20s/30s which was reportedly normal  No other acute gi complaints.  Past Medical History:  Diagnosis Date   Allergic genetic state    Chest pain    Diabetes mellitus without complication ([place])    GERD (gastroesophageal reflux disease)    Hypercholesteremia    Hypertension    Obesity    Palpitations    a. seen by cardiology in [place] and apparently wore a monitor - unrevealing (2012).   Schatzki's ring    a. s/p dilatations in the past.   Sleep apnea     Past Surgical History:  Procedure Laterality Date   LEFT HEART CATHETERIZATION WITH CORONARY ANGIOGRAM N/A 02/15/2013   Procedure: LEFT HEART CATHETERIZATION WITH CORONARY ANGIOGRAM;  Surgeon: Thayer Headings, MD;  Location: [facility] CATH LAB;  Service: Cardiovascular;  Laterality: N/A;   ROTATOR CUFF REPAIR      Family History No h/o GI disease or malignancy  Review of Systems  Constitutional:  Negative for activity change, appetite change, chills, diaphoresis, fatigue, fever and unexpected weight change.  HENT:  Negative for trouble swallowing and voice change.   Respiratory:  Negative for shortness of breath and wheezing.   Cardiovascular:  Negative for chest pain, palpitations and leg swelling.  Gastrointestinal:  Negative for abdominal distention, abdominal pain, anal bleeding, blood in stool, constipation, diarrhea, nausea and vomiting.  Musculoskeletal:  Negative for arthralgias and myalgias.  Skin:  Negative for color change and pallor.  Neurological:  Negative for dizziness, syncope and weakness.   Psychiatric/Behavioral:  Negative for confusion. The patient is not nervous/anxious.   All other systems reviewed and are negative.   Medications No current facility-administered medications on file prior to encounter.   Current Outpatient Medications on File Prior to Encounter  Medication Sig Dispense Refill   amLODipine (NORVASC) 10 MG tablet Take 5 mg by mouth daily.     APPLE CIDER VINEGAR PO Take 1 tablet by mouth 2 (two) times daily.     hydrochlorothiazide (HYDRODIURIL) 12.5 MG tablet Take 12.5 mg by mouth daily.     lisinopril (PRINIVIL,ZESTRIL) 40 MG tablet Take 1 tablet (40 mg total) by mouth daily. 30 tablet 2   lovastatin (MEVACOR) 20 MG tablet Take 1 tablet (20 mg total) by mouth daily. 30 tablet 2   Multiple Vitamins-Minerals (MULTIVITAMIN & MINERAL PO) Take 1 tablet by mouth daily.     buPROPion (WELLBUTRIN XL) 150 MG 24 hr tablet Take 2 tablets by mouth daily.      Pertinent medications related to GI and procedure were reviewed by me with the patient prior to the procedure   Current Facility-Administered Medications:    0.9 %  sodium chloride infusion, , Intravenous, Continuous, Annamaria Helling, DO, Last Rate: 20 mL/hr at 07/13/21 0702, 20 mL/hr at 07/13/21 2458  sodium chloride 20 mL/hr (07/13/21 0702)       No Known Allergies Allergies were reviewed by me prior to the procedure  Objective    Vitals:   07/13/21 0649  BP: (!) 166/108  Pulse: 96  Resp: 20  Temp: Marland Kitchen)  97.2 F (36.2 C)  TempSrc: Temporal  Weight: (!) 140.6 kg  Height: 5\' 11"  (1.803 m)     Physical Exam Vitals and nursing note reviewed.  Constitutional:      General: He is not in acute distress.    Appearance: Normal appearance. He is obese. He is not ill-appearing, toxic-appearing or diaphoretic.  HENT:     Head: Normocephalic and atraumatic.     Nose: Nose normal.     Mouth/Throat:     Mouth: Mucous membranes are moist.     Pharynx: Oropharynx is clear.  Eyes:     General:  No scleral icterus.    Extraocular Movements: Extraocular movements intact.  Cardiovascular:     Rate and Rhythm: Normal rate and regular rhythm.     Heart sounds: Normal heart sounds. No murmur heard.   No friction rub. No gallop.  Pulmonary:     Effort: Pulmonary effort is normal. No respiratory distress.     Breath sounds: Normal breath sounds. No wheezing, rhonchi or rales.  Abdominal:     General: Bowel sounds are normal. There is no distension.     Palpations: Abdomen is soft.     Tenderness: There is no abdominal tenderness. There is no guarding or rebound.  Musculoskeletal:     Cervical back: Neck supple.     Right lower leg: No edema.     Left lower leg: No edema.  Skin:    General: Skin is warm and dry.     Coloration: Skin is not jaundiced or pale.  Neurological:     General: No focal deficit present.     Mental Status: He is alert and oriented to person, place, and time. Mental status is at baseline.  Psychiatric:        Mood and Affect: Mood normal.        Behavior: Behavior normal.        Thought Content: Thought content normal.        Judgment: Judgment normal.     Assessment:  Mr. Vincent Hicks is a 50 y.o. male  who presents today for Colonoscopy for screening colonoscopy.  Plan:  Colonoscopy with possible intervention today  Colonoscopy with possible biopsy, control of bleeding, polypectomy, and interventions as necessary has been discussed with the patient/patient representative. Informed consent was obtained from the patient/patient representative after explaining the indication, nature, and risks of the procedure including but not limited to death, bleeding, perforation, missed neoplasm/lesions, cardiorespiratory compromise, and reaction to medications. Opportunity for questions was given and appropriate answers were provided. Patient/patient representative has verbalized understanding is amenable to undergoing the procedure.   Annamaria Helling, DO   [facility]  Portions of the record may have been created with voice recognition software. Occasional wrong-word or 'sound-a-like' substitutions may have occurred due to the inherent limitations of voice recognition software.  Read the chart carefully and recognize, using context, where substitutions may have occurred.

## 2021-07-13 NOTE — Anesthesia Procedure Notes (Signed)
Date/Time: 07/13/2021 7:34 AM Performed by: Doreen Salvage, CRNA Pre-anesthesia Checklist: Patient identified, Emergency Drugs available, Suction available and Patient being monitored Patient Re-evaluated:Patient Re-evaluated prior to induction Oxygen Delivery Method: Supernova nasal CPAP Induction Type: IV induction Dental Injury: Teeth and Oropharynx as per pre-operative assessment  Comments: Nasal cannula with etCO2 monitoring

## 2021-07-16 ENCOUNTER — Encounter: Payer: Self-pay | Admitting: Gastroenterology

## 2021-07-16 LAB — SURGICAL PATHOLOGY

## 2021-07-30 NOTE — Anesthesia Postprocedure Evaluation (Signed)
Anesthesia Post Note  Patient: Vincent Hicks  Procedure(s) Performed: COLONOSCOPY  Patient location during evaluation: PACU Anesthesia Type: General Level of consciousness: awake and oriented Pain management: satisfactory to patient Vital Signs Assessment: post-procedure vital signs reviewed and stable Respiratory status: spontaneous breathing and respiratory function stable Cardiovascular status: stable Anesthetic complications: no   No notable events documented.   Last Vitals:  Vitals:   07/13/21 0823 07/13/21 0844  BP: 111/76 (!) 129/93  Pulse: 89   Resp: 18   Temp:  (!) 35.6 C  SpO2: 97%     Last Pain:  Vitals:   07/14/21 0907  TempSrc:   PainSc: 0-No pain                 VAN STAVEREN,Harwood Nall

## 2023-05-09 ENCOUNTER — Ambulatory Visit: Payer: Managed Care, Other (non HMO) | Admitting: Certified Registered"

## 2023-05-09 ENCOUNTER — Ambulatory Visit
Admission: RE | Admit: 2023-05-09 | Discharge: 2023-05-09 | Disposition: A | Discharge status: Home / Self Care | Payer: Managed Care, Other (non HMO) | Attending: Gastroenterology | Admitting: Gastroenterology

## 2023-05-09 ENCOUNTER — Other Ambulatory Visit: Payer: Self-pay

## 2023-05-09 ENCOUNTER — Encounter: Payer: Self-pay | Admitting: Gastroenterology

## 2023-05-09 ENCOUNTER — Encounter
Admission: RE | Disposition: A | Discharge status: Home / Self Care | Payer: Self-pay | Source: Home / Self Care | Attending: Gastroenterology

## 2023-05-09 DIAGNOSIS — Z6839 Body mass index (BMI) 39.0-39.9, adult: Secondary | ICD-10-CM | POA: Diagnosis not present

## 2023-05-09 DIAGNOSIS — E669 Obesity, unspecified: Secondary | ICD-10-CM | POA: Insufficient documentation

## 2023-05-09 DIAGNOSIS — E7800 Pure hypercholesterolemia, unspecified: Secondary | ICD-10-CM | POA: Insufficient documentation

## 2023-05-09 DIAGNOSIS — R1310 Dysphagia, unspecified: Secondary | ICD-10-CM | POA: Diagnosis present

## 2023-05-09 DIAGNOSIS — Z800 Family history of malignant neoplasm of digestive organs: Secondary | ICD-10-CM | POA: Diagnosis not present

## 2023-05-09 DIAGNOSIS — R002 Palpitations: Secondary | ICD-10-CM | POA: Insufficient documentation

## 2023-05-09 DIAGNOSIS — G4730 Sleep apnea, unspecified: Secondary | ICD-10-CM | POA: Diagnosis not present

## 2023-05-09 DIAGNOSIS — K2950 Unspecified chronic gastritis without bleeding: Secondary | ICD-10-CM | POA: Diagnosis not present

## 2023-05-09 DIAGNOSIS — K3189 Other diseases of stomach and duodenum: Secondary | ICD-10-CM | POA: Diagnosis not present

## 2023-05-09 DIAGNOSIS — B9681 Helicobacter pylori [H. pylori] as the cause of diseases classified elsewhere: Secondary | ICD-10-CM | POA: Diagnosis not present

## 2023-05-09 DIAGNOSIS — Z79899 Other long term (current) drug therapy: Secondary | ICD-10-CM | POA: Diagnosis not present

## 2023-05-09 DIAGNOSIS — K2100 Gastro-esophageal reflux disease with esophagitis, without bleeding: Secondary | ICD-10-CM | POA: Insufficient documentation

## 2023-05-09 DIAGNOSIS — E119 Type 2 diabetes mellitus without complications: Secondary | ICD-10-CM | POA: Diagnosis not present

## 2023-05-09 DIAGNOSIS — K222 Esophageal obstruction: Secondary | ICD-10-CM | POA: Insufficient documentation

## 2023-05-09 DIAGNOSIS — I10 Essential (primary) hypertension: Secondary | ICD-10-CM | POA: Diagnosis not present

## 2023-05-09 DIAGNOSIS — Z7985 Long-term (current) use of injectable non-insulin antidiabetic drugs: Secondary | ICD-10-CM | POA: Diagnosis not present

## 2023-05-09 HISTORY — PX: ESOPHAGOGASTRODUODENOSCOPY (EGD) WITH PROPOFOL: SHX5813

## 2023-05-09 HISTORY — PX: BIOPSY: SHX5522

## 2023-05-09 SURGERY — ESOPHAGOGASTRODUODENOSCOPY (EGD) WITH PROPOFOL
Anesthesia: General

## 2023-05-09 MED ORDER — SUCCINYLCHOLINE CHLORIDE 200 MG/10ML IV SOSY
PREFILLED_SYRINGE | INTRAVENOUS | Status: DC | PRN
Start: 2023-05-09 — End: 2023-05-09
  Administered 2023-05-09: 120 mg via INTRAVENOUS

## 2023-05-09 MED ORDER — SODIUM CHLORIDE 0.9 % IV SOLN: INTRAVENOUS | Status: DC

## 2023-05-09 MED ORDER — GLYCOPYRROLATE 0.2 MG/ML IJ SOLN
INTRAMUSCULAR | Status: AC
  Filled 2023-05-09: qty 1

## 2023-05-09 MED ORDER — PROPOFOL 10 MG/ML IV BOLUS
INTRAVENOUS | Status: AC
  Filled 2023-05-09: qty 40

## 2023-05-09 MED ORDER — LIDOCAINE HCL (PF) 2 % IJ SOLN
INTRAMUSCULAR | Status: AC
  Filled 2023-05-09: qty 5

## 2023-05-09 MED ORDER — LIDOCAINE HCL (CARDIAC) PF 100 MG/5ML IV SOSY
PREFILLED_SYRINGE | INTRAVENOUS | Status: DC | PRN
  Administered 2023-05-09: 100 mg via INTRAVENOUS

## 2023-05-09 MED ORDER — FENTANYL CITRATE (PF) 100 MCG/2ML IJ SOLN
INTRAMUSCULAR | Status: DC | PRN
  Administered 2023-05-09 (×2): 50 ug via INTRAVENOUS

## 2023-05-09 MED ORDER — ONDANSETRON HCL 4 MG/2ML IJ SOLN
INTRAMUSCULAR | Status: DC | PRN
  Administered 2023-05-09: 4 mg via INTRAVENOUS

## 2023-05-09 MED ORDER — GLYCOPYRROLATE 0.2 MG/ML IJ SOLN
INTRAMUSCULAR | Status: DC | PRN
Start: 2023-05-09 — End: 2023-05-09
  Administered 2023-05-09: .1 mg via INTRAVENOUS

## 2023-05-09 MED ORDER — PROPOFOL 10 MG/ML IV BOLUS
INTRAVENOUS | Status: DC | PRN
Start: 2023-05-09 — End: 2023-05-09
  Administered 2023-05-09: 200 mg via INTRAVENOUS

## 2023-05-09 MED ORDER — DEXAMETHASONE SODIUM PHOSPHATE 10 MG/ML IJ SOLN
INTRAMUSCULAR | Status: DC | PRN
Start: 2023-05-09 — End: 2023-05-09
  Administered 2023-05-09: 5 mg via INTRAVENOUS

## 2023-05-09 NOTE — Op Note (Signed)
[facility] Patient Name: Vincent Hicks Procedure Date: 05/09/2023 11:28 AM MRN: [id number] Account #: [id number] Date of Birth: [birth year] Admit Type: Outpatient Age: 52 Room: [facility] ENDO ROOM 1 Gender: Male Note Status: Finalized Instrument Name: Upper Endoscope [id number] Procedure:             Upper GI endoscopy Indications:           Dysphagia Providers:             Trenda Moots, DO Referring MD:          Carren Rang (Referring MD) Medicines:             Monitored Anesthesia Care Complications:         No immediate complications. Estimated blood loss:                         Minimal. Procedure:             Pre-Anesthesia Assessment:                        - Prior to the procedure, a History and Physical was                         performed, and patient medications and allergies were                         reviewed. The patient is competent. The risks and                         benefits of the procedure and the sedation options and                         risks were discussed with the patient. All questions                         were answered and informed consent was obtained.                         Patient identification and proposed procedure were                         verified by the physician, the nurse, the anesthetist                         and the technician in the endoscopy suite. Mental                         Status Examination: alert and oriented. Airway                         Examination: normal oropharyngeal airway and neck                         mobility. Respiratory Examination: clear to                         auscultation. CV Examination: RRR, no murmurs, no S3  or S4. Prophylactic Antibiotics: The patient does not                         require prophylactic antibiotics. Prior                         Anticoagulants: The patient has taken no anticoagulant                         or  antiplatelet agents. ASA Grade Assessment: III - A                         patient with severe systemic disease. After reviewing                         the risks and benefits, the patient was deemed in                         satisfactory condition to undergo the procedure. The                         anesthesia plan was to use monitored anesthesia care                         (MAC). Immediately prior to administration of                         medications, the patient was re-assessed for adequacy                         to receive sedatives. The heart rate, respiratory                         rate, oxygen saturations, blood pressure, adequacy of                         pulmonary ventilation, and response to care were                         monitored throughout the procedure. The physical                         status of the patient was re-assessed after the                         procedure.                        After obtaining informed consent, the endoscope was                         passed under direct vision. Throughout the procedure,                         the patient's blood pressure, pulse, and oxygen                         saturations were monitored continuously. The Endoscope  was introduced through the mouth, and advanced to the                         second part of duodenum. The upper GI endoscopy was                         accomplished without difficulty. The patient tolerated                         the procedure well. Findings:      A medium-sized submucosal mass with no bleeding was found in the first       portion of the duodenum. Biopsies were taken with a cold forceps for       histology. Estimated blood loss was minimal.      The exam of the duodenum was otherwise normal.      Diffuse moderate inflammation characterized by erosions, erythema,       friability and granularity was found in the entire examined stomach.       Biopsies were  taken with a cold forceps for Helicobacter pylori testing.       Estimated blood loss was minimal.      The exam of the stomach was otherwise normal.      LA Grade D (one or more mucosal breaks involving at least 75% of       esophageal circumference) esophagitis with no bleeding was found 27 to       44 cm from the incisors. Biopsies were taken with a cold forceps for       histology. Estimated blood loss was minimal.      Esophagogastric landmarks were identified: the gastroesophageal junction       was found at 44 cm from the incisors.      The exam of the esophagus was otherwise normal. Impression:            - Duodenal mass. Biopsied.                        - Gastritis. Biopsied.                        - LA Grade D esophagitis with no bleeding. Biopsied.                        - Esophagogastric landmarks identified. Recommendation:        - Patient has a contact number available for                         emergencies. The signs and symptoms of potential                         delayed complications were discussed with the patient.                         Return to normal activities tomorrow. Written                         discharge instructions were provided to the patient.                        - Discharge patient  to home.                        - Resume previous diet.                        - Continue present medications.                        - Will prescribe reflux medication to be taken for at                         least 8 weeks.                        - Await pathology results.                        - Repeat upper endoscopy in 8 weeks to evaluate the                         response to therapy and for retreatment.                        - Return to GI office at appointment to be scheduled.                        - The findings and recommendations were discussed with                         the patient. Procedure Code(s):     --- Professional ---                         [phone], Esophagogastroduodenoscopy, flexible,                         transoral; with biopsy, single or multiple Diagnosis Code(s):     --- Professional ---                        K31.89, Other diseases of stomach and duodenum                        K29.70, Gastritis, unspecified, without bleeding                        K20.90, Esophagitis, unspecified without bleeding                        R13.10, Dysphagia, unspecified CPT copyright 2022 American Medical Association. All rights reserved. The codes documented in this report are preliminary and upon coder review may  be revised to meet current compliance requirements. Attending Participation:      I personally performed the entire procedure. Elfredia Nevins, DO Jaynie Collins DO, DO 05/09/2023 12:00:10 PM This report has been signed electronically. Number of Addenda: 0 Note Initiated On: 05/09/2023 11:28 AM Estimated Blood Loss:  Estimated blood loss was minimal.      [facility]

## 2023-05-09 NOTE — H&P (Signed)
Pre-Procedure H&P   Patient ID: Vincent Hicks is a 52 y.o. male.  Gastroenterology Provider: Jaynie Collins, DO  Referring Provider: Tawni Pummel, PA PCP: Carren Rang, PA-C  Date: 05/09/2023  HPI Vincent Hicks is a 52 y.o. male who presents today for Esophagogastroduodenoscopy for Dysphagia, GERD, Schatzki's ring .  Patient with a history of Schatzki's ring that was dilated 7 years ago.  For the last few months he has had dysphagia to solids.  No issues with liquids or pills.  No odynophagia.  No appetite change or weight loss.  He denies any reflux or regurgitation symptoms.  He is on tirzepatide which has been held for this procedure  No other acute gi complaints  Past Medical History:  Diagnosis Date   Allergic genetic state    Chest pain    Diabetes mellitus without complication (HCC)    GERD (gastroesophageal reflux disease)    Hypercholesteremia    Hypertension    Obesity    Palpitations    a. seen by cardiology in [place] and apparently wore a monitor - unrevealing (2012).   Schatzki's ring    a. s/p dilatations in the past.   Sleep apnea     Past Surgical History:  Procedure Laterality Date   COLONOSCOPY N/A 07/13/2021   Procedure: COLONOSCOPY;  Surgeon: Jaynie Collins, DO;  Location: [facility] ENDOSCOPY;  Service: Gastroenterology;  Laterality: N/A;   LEFT HEART CATHETERIZATION WITH CORONARY ANGIOGRAM N/A 02/15/2013   Procedure: LEFT HEART CATHETERIZATION WITH CORONARY ANGIOGRAM;  Surgeon: Vesta Mixer, MD;  Location: [facility] CATH LAB;  Service: Cardiovascular;  Laterality: N/A;   ROTATOR CUFF REPAIR      Family History Paternal grandfather history of gastric and liver cancer No h/o GI disease or malignancy  Review of Systems  Constitutional:  Negative for activity change, appetite change, chills, diaphoresis, fatigue, fever and unexpected weight change.  HENT:  Positive for trouble swallowing. Negative for voice change.    Respiratory:  Negative for shortness of breath and wheezing.   Cardiovascular:  Negative for chest pain, palpitations and leg swelling.  Gastrointestinal:  Negative for abdominal distention, abdominal pain, anal bleeding, blood in stool, constipation, diarrhea, nausea and vomiting.  Musculoskeletal:  Negative for arthralgias and myalgias.  Skin:  Negative for color change and pallor.  Neurological:  Negative for dizziness, syncope and weakness.  Psychiatric/Behavioral:  Negative for confusion. The patient is not nervous/anxious.   All other systems reviewed and are negative.    Medications No current facility-administered medications on file prior to encounter.   Current Outpatient Medications on File Prior to Encounter  Medication Sig Dispense Refill   amLODipine (NORVASC) 10 MG tablet Take 5 mg by mouth daily.     hydrochlorothiazide (HYDRODIURIL) 12.5 MG tablet Take 12.5 mg by mouth daily.     lisinopril (PRINIVIL,ZESTRIL) 40 MG tablet Take 1 tablet (40 mg total) by mouth daily. 30 tablet 2   lovastatin (MEVACOR) 20 MG tablet Take 1 tablet (20 mg total) by mouth daily. 30 tablet 2   Multiple Vitamins-Minerals (MULTIVITAMIN & MINERAL PO) Take 1 tablet by mouth daily.     tirzepatide [facility]) 5 MG/0.5ML Pen Inject 5 mg into the skin once a week.     APPLE CIDER VINEGAR PO Take 1 tablet by mouth 2 (two) times daily.     buPROPion (WELLBUTRIN XL) 150 MG 24 hr tablet Take 2 tablets by mouth daily.      Pertinent medications related to GI  and procedure were reviewed by me with the patient prior to the procedure   Current Facility-Administered Medications:    0.9 %  sodium chloride infusion, , Intravenous, Continuous, Jaynie Collins, DO, Last Rate: 20 mL/hr at 05/09/23 1129, Continued from Pre-op at 05/09/23 1129  sodium chloride 20 mL/hr at 05/09/23 1129       No Known Allergies Allergies were reviewed by me prior to the procedure  Objective   Body mass index is 39.75  kg/m. Vitals:   05/09/23 1112  BP: (!) 131/92  Pulse: 80  Resp: 16  Temp: (!) 96.6 F (35.9 C)  TempSrc: Temporal  SpO2: 96%  Weight: 129.3 kg  Height: 5\' 11"  (1.803 m)     Physical Exam Vitals and nursing note reviewed.  Constitutional:      General: He is not in acute distress.    Appearance: Normal appearance. He is obese. He is not ill-appearing, toxic-appearing or diaphoretic.  HENT:     Head: Normocephalic and atraumatic.     Nose: Nose normal.     Mouth/Throat:     Mouth: Mucous membranes are moist.     Pharynx: Oropharynx is clear.  Eyes:     General: No scleral icterus.    Extraocular Movements: Extraocular movements intact.  Cardiovascular:     Rate and Rhythm: Normal rate and regular rhythm.     Heart sounds: Normal heart sounds. No murmur heard.    No friction rub. No gallop.  Pulmonary:     Effort: Pulmonary effort is normal. No respiratory distress.     Breath sounds: Normal breath sounds. No wheezing, rhonchi or rales.  Abdominal:     General: Bowel sounds are normal. There is no distension.     Palpations: Abdomen is soft.     Tenderness: There is no abdominal tenderness. There is no guarding or rebound.  Musculoskeletal:     Cervical back: Neck supple.     Right lower leg: No edema.     Left lower leg: No edema.  Skin:    General: Skin is warm and dry.     Coloration: Skin is not jaundiced or pale.  Neurological:     General: No focal deficit present.     Mental Status: He is alert and oriented to person, place, and time. Mental status is at baseline.  Psychiatric:        Mood and Affect: Mood normal.        Behavior: Behavior normal.        Thought Content: Thought content normal.        Judgment: Judgment normal.      Assessment:  Vincent Hicks is a 52 y.o. male  who presents today for Esophagogastroduodenoscopy for Dysphagia, GERD, Schatzki's ring.  Plan:  Esophagogastroduodenoscopy with possible intervention  today  Esophagogastroduodenoscopy with possible biopsy, control of bleeding, polypectomy, and interventions as necessary has been discussed with the patient/patient representative. Informed consent was obtained from the patient/patient representative after explaining the indication, nature, and risks of the procedure including but not limited to death, bleeding, perforation, missed neoplasm/lesions, cardiorespiratory compromise, and reaction to medications. Opportunity for questions was given and appropriate answers were provided. Patient/patient representative has verbalized understanding is amenable to undergoing the procedure.   Jaynie Collins, DO  [facility] Gastroenterology  Portions of the record may have been created with voice recognition software. Occasional wrong-word or 'sound-a-like' substitutions may have occurred due to the inherent limitations of voice recognition software.  Read the chart carefully and recognize, using context, where substitutions may have occurred.

## 2023-05-09 NOTE — Anesthesia Postprocedure Evaluation (Signed)
Anesthesia Post Note  Patient: Vincent Hicks  Procedure(s) Performed: ESOPHAGOGASTRODUODENOSCOPY (EGD) WITH PROPOFOL BIOPSY  Patient location during evaluation: Endoscopy Anesthesia Type: General Level of consciousness: awake and alert Pain management: pain level controlled Vital Signs Assessment: post-procedure vital signs reviewed and stable Respiratory status: spontaneous breathing, nonlabored ventilation, respiratory function stable and patient connected to nasal cannula oxygen Cardiovascular status: blood pressure returned to baseline and stable Postop Assessment: no apparent nausea or vomiting Anesthetic complications: no  No notable events documented.   Last Vitals:  Vitals:   05/09/23 1112 05/09/23 1201  BP: (!) 131/92 (!) 149/107  Pulse: 80 86  Resp: 16 (!) 21  Temp: (!) 35.9 C 36.7 C  SpO2: 96% 98%    Last Pain:  Vitals:   05/09/23 1201  TempSrc: Temporal  PainSc:                  Stephanie Coup

## 2023-05-09 NOTE — Transfer of Care (Signed)
Immediate Anesthesia Transfer of Care Note  Patient: Vincent Hicks  Procedure(s) Performed: ESOPHAGOGASTRODUODENOSCOPY (EGD) WITH PROPOFOL BIOPSY  Patient Location: Endoscopy Unit  Anesthesia Type:General  Level of Consciousness: awake, alert , and oriented  Airway & Oxygen Therapy: Patient Spontanous Breathing  Post-op Assessment: Report given to RN and Post -op Vital signs reviewed and stable  Post vital signs: Reviewed and stable  Last Vitals:  Vitals Value Taken Time  BP 144/91 05/09/23 1201  Temp 36.7 C 05/09/23 1201  Pulse 84 05/09/23 1204  Resp 19 05/09/23 1204  SpO2 98 % 05/09/23 1204  Vitals shown include unfiled device data.  Last Pain:  Vitals:   05/09/23 1201  TempSrc: Temporal  PainSc:          Complications: No notable events documented.

## 2023-05-09 NOTE — Interval H&P Note (Signed)
History and Physical Interval Note: Preprocedure H&P from 05/09/23  was reviewed and there was no interval change after seeing and examining the patient.  Written consent was obtained from the patient after discussion of risks, benefits, and alternatives. Patient has consented to proceed with Esophagogastroduodenoscopy with possible intervention   05/09/2023 11:31 AM  Vincent Hicks  has presented today for surgery, with the diagnosis of K21.9 (ICD-10-CM) - Gastroesophageal reflux disease, unspecified whether esophagitis present R13.10 (ICD-10-CM) - Dysphagia, unspecified type.  The various methods of treatment have been discussed with the patient and family. After consideration of risks, benefits and other options for treatment, the patient has consented to  Procedure(s): ESOPHAGOGASTRODUODENOSCOPY (EGD) WITH PROPOFOL (N/A) as a surgical intervention.  The patient's history has been reviewed, patient examined, no change in status, stable for surgery.  I have reviewed the patient's chart and labs.  Questions were answered to the patient's satisfaction.     Jaynie Collins

## 2023-05-09 NOTE — Anesthesia Procedure Notes (Signed)
Procedure Name: Intubation Date/Time: 05/09/2023 11:43 AM  Performed by: Morene Crocker, CRNAPre-anesthesia Checklist: Patient identified, Patient being monitored, Timeout performed, Emergency Drugs available and Suction available Patient Re-evaluated:Patient Re-evaluated prior to induction Oxygen Delivery Method: Circle system utilized Preoxygenation: Pre-oxygenation with 100% oxygen Induction Type: IV induction, Cricoid Pressure applied and Rapid sequence Laryngoscope Size: 3 and McGraph Grade View: Grade I Tube type: Oral Tube size: 7.5 mm Number of attempts: 1 Airway Equipment and Method: Stylet Placement Confirmation: ETT inserted through vocal cords under direct vision, positive ETCO2 and breath sounds checked- equal and bilateral Secured at: 22 cm Tube secured with: Tape Dental Injury: Teeth and Oropharynx as per pre-operative assessment  Comments: Smooth, atraumatic intubation. No complications noted.

## 2023-05-09 NOTE — Anesthesia Preprocedure Evaluation (Signed)
Anesthesia Evaluation  Patient identified by MRN, date of birth, ID band Patient awake    Reviewed: Allergy & Precautions, NPO status , Patient's Chart, lab work & pertinent test results  Airway Mallampati: III  TM Distance: >3 FB Neck ROM: full    Dental  (+) Chipped   Pulmonary sleep apnea and Continuous Positive Airway Pressure Ventilation    Pulmonary exam normal        Cardiovascular hypertension, negative cardio ROS Normal cardiovascular exam     Neuro/Psych negative neurological ROS  negative psych ROS   GI/Hepatic negative GI ROS, Neg liver ROS,,,  Endo/Other  negative endocrine ROSdiabetes    Renal/GU      Musculoskeletal   Abdominal   Peds  Hematology negative hematology ROS (+)   Anesthesia Other Findings Patient taking Mounjaro last Saturday (9/21). Patient denies feeling any symptoms such as nausea, vomiting, or feeling bloated. Discussed the risks of using a GLP-1 inhibitor and the need for a breathing tube. Discussed risks of breathing tubes. Patient stated he understood and agreed to proceed.    Past Medical History: No date: Allergic genetic state No date: Chest pain No date: Diabetes mellitus without complication (HCC) No date: GERD (gastroesophageal reflux disease) No date: Hypercholesteremia No date: Hypertension No date: Obesity No date: Palpitations     Comment:  a. seen by cardiology in [place] and apparently wore               a monitor - unrevealing (2012). No date: Schatzki's ring     Comment:  a. s/p dilatations in the past. No date: Sleep apnea  Past Surgical History: 07/13/2021: COLONOSCOPY; N/A     Comment:  Procedure: COLONOSCOPY;  Surgeon: Jaynie Collins,              DO;  Location: [facility] ENDOSCOPY;  Service:               Gastroenterology;  Laterality: N/A; 02/15/2013: LEFT HEART CATHETERIZATION WITH CORONARY ANGIOGRAM; N/A     Comment:  Procedure: LEFT HEART  CATHETERIZATION WITH CORONARY               ANGIOGRAM;  Surgeon: Vesta Mixer, MD;  Location: [facility]               CATH LAB;  Service: Cardiovascular;  Laterality: N/A; No date: ROTATOR CUFF REPAIR     Reproductive/Obstetrics negative OB ROS                             Anesthesia Physical Anesthesia Plan  ASA: 2  Anesthesia Plan: General ETT and General   Post-op Pain Management:    Induction: Intravenous  PONV Risk Score and Plan: Ondansetron and Midazolam  Airway Management Planned: Oral ETT  Additional Equipment:   Intra-op Plan:   Post-operative Plan: Extubation in OR  Informed Consent: I have reviewed the patients History and Physical, chart, labs and discussed the procedure including the risks, benefits and alternatives for the proposed anesthesia with the patient or authorized representative who has indicated his/her understanding and acceptance.     Dental Advisory Given  Plan Discussed with: Anesthesiologist, CRNA and Surgeon  Anesthesia Plan Comments: (Patient consented for risks of anesthesia including but not limited to:  - adverse reactions to medications - damage to eyes, teeth, lips or other oral mucosa - nerve damage due to positioning  - sore throat or hoarseness - Damage to heart, brain, nerves,  lungs, other parts of body or loss of life  Patient voiced understanding.)       Anesthesia Quick Evaluation

## 2023-05-12 ENCOUNTER — Encounter: Payer: Self-pay | Admitting: Gastroenterology

## 2023-05-13 LAB — SURGICAL PATHOLOGY

## 2023-07-03 ENCOUNTER — Encounter: Payer: Self-pay | Admitting: Gastroenterology

## 2023-07-23 ENCOUNTER — Encounter: Payer: Self-pay | Admitting: Gastroenterology

## 2023-07-24 ENCOUNTER — Ambulatory Visit: Payer: Managed Care, Other (non HMO) | Admitting: Registered Nurse

## 2023-07-24 ENCOUNTER — Encounter
Admission: RE | Disposition: A | Discharge status: Home / Self Care | Payer: Self-pay | Source: Home / Self Care | Attending: Gastroenterology

## 2023-07-24 ENCOUNTER — Other Ambulatory Visit: Payer: Self-pay

## 2023-07-24 ENCOUNTER — Ambulatory Visit
Admission: RE | Admit: 2023-07-24 | Discharge: 2023-07-24 | Disposition: A | Discharge status: Home / Self Care | Payer: Managed Care, Other (non HMO) | Attending: Gastroenterology | Admitting: Gastroenterology

## 2023-07-24 ENCOUNTER — Encounter: Payer: Self-pay | Admitting: Gastroenterology

## 2023-07-24 DIAGNOSIS — G4730 Sleep apnea, unspecified: Secondary | ICD-10-CM | POA: Insufficient documentation

## 2023-07-24 DIAGNOSIS — K2100 Gastro-esophageal reflux disease with esophagitis, without bleeding: Secondary | ICD-10-CM | POA: Diagnosis present

## 2023-07-24 DIAGNOSIS — Z6841 Body Mass Index (BMI) 40.0 and over, adult: Secondary | ICD-10-CM | POA: Diagnosis not present

## 2023-07-24 DIAGNOSIS — K3189 Other diseases of stomach and duodenum: Secondary | ICD-10-CM | POA: Diagnosis not present

## 2023-07-24 DIAGNOSIS — Z7985 Long-term (current) use of injectable non-insulin antidiabetic drugs: Secondary | ICD-10-CM | POA: Insufficient documentation

## 2023-07-24 DIAGNOSIS — Z87891 Personal history of nicotine dependence: Secondary | ICD-10-CM | POA: Insufficient documentation

## 2023-07-24 DIAGNOSIS — I10 Essential (primary) hypertension: Secondary | ICD-10-CM | POA: Insufficient documentation

## 2023-07-24 DIAGNOSIS — K2950 Unspecified chronic gastritis without bleeding: Secondary | ICD-10-CM | POA: Diagnosis not present

## 2023-07-24 DIAGNOSIS — Z8619 Personal history of other infectious and parasitic diseases: Secondary | ICD-10-CM | POA: Diagnosis not present

## 2023-07-24 DIAGNOSIS — Z79899 Other long term (current) drug therapy: Secondary | ICD-10-CM | POA: Insufficient documentation

## 2023-07-24 HISTORY — PX: BIOPSY: SHX5522

## 2023-07-24 HISTORY — PX: ESOPHAGOGASTRODUODENOSCOPY (EGD) WITH PROPOFOL: SHX5813

## 2023-07-24 LAB — GLUCOSE, CAPILLARY: Glucose-Capillary: 111 mg/dL — ABNORMAL HIGH (ref 70–99)

## 2023-07-24 SURGERY — ESOPHAGOGASTRODUODENOSCOPY (EGD) WITH PROPOFOL
Anesthesia: General

## 2023-07-24 MED ORDER — PROPOFOL 10 MG/ML IV BOLUS
INTRAVENOUS | Status: DC | PRN
  Administered 2023-07-24: 30 mg via INTRAVENOUS
  Administered 2023-07-24: 70 mg via INTRAVENOUS
  Administered 2023-07-24: 50 mg via INTRAVENOUS
  Administered 2023-07-24: 130 mg via INTRAVENOUS

## 2023-07-24 MED ORDER — PHENYLEPHRINE 80 MCG/ML (10ML) SYRINGE FOR IV PUSH (FOR BLOOD PRESSURE SUPPORT)
PREFILLED_SYRINGE | INTRAVENOUS | Status: AC
Start: 2023-07-24 — End: ?
  Filled 2023-07-24: qty 10

## 2023-07-24 MED ORDER — GLYCOPYRROLATE 0.2 MG/ML IJ SOLN
INTRAMUSCULAR | Status: DC | PRN
  Administered 2023-07-24: .2 mg via INTRAVENOUS

## 2023-07-24 MED ORDER — LIDOCAINE HCL (CARDIAC) PF 100 MG/5ML IV SOSY
PREFILLED_SYRINGE | INTRAVENOUS | Status: DC | PRN
  Administered 2023-07-24: 100 mg via INTRAVENOUS

## 2023-07-24 MED ORDER — GLYCOPYRROLATE 0.2 MG/ML IJ SOLN
INTRAMUSCULAR | Status: AC
  Filled 2023-07-24: qty 1

## 2023-07-24 MED ORDER — PHENYLEPHRINE HCL (PRESSORS) 10 MG/ML IV SOLN
INTRAVENOUS | Status: DC | PRN
  Administered 2023-07-24: 80 ug via INTRAVENOUS
  Administered 2023-07-24: 120 ug via INTRAVENOUS

## 2023-07-24 MED ORDER — SODIUM CHLORIDE 0.9 % IV SOLN
INTRAVENOUS | Status: DC
Start: 2023-07-24 — End: 2023-07-24

## 2023-07-24 NOTE — Op Note (Signed)
[facility] Gastroenterology Patient Name: Vincent Hicks Procedure Date: 07/24/2023 2:16 PM MRN: [id number] Account #: [id number] Date of Birth: 07-04-1971 Admit Type: Outpatient Age: 52 Room: [facility] ENDO ROOM 1 Gender: Male Note Status: Finalized Instrument Name: Upper Endoscope [id number] Procedure:             Upper GI endoscopy Indications:           Follow-up of reflux esophagitis Providers:             Trenda Moots, DO Referring MD:          Carren Rang (Referring MD) Medicines:             Monitored Anesthesia Care Complications:         No immediate complications. Estimated blood loss:                         Minimal. Procedure:             Pre-Anesthesia Assessment:                        - Prior to the procedure, a History and Physical was                         performed, and patient medications and allergies were                         reviewed. The patient is competent. The risks and                         benefits of the procedure and the sedation options and                         risks were discussed with the patient. All questions                         were answered and informed consent was obtained.                         Patient identification and proposed procedure were                         verified by the physician, the nurse, the anesthetist                         and the technician in the endoscopy suite. Mental                         Status Examination: alert and oriented. Airway                         Examination: normal oropharyngeal airway and neck                         mobility. Respiratory Examination: clear to                         auscultation. CV Examination: RRR, no murmurs, no S3  or S4. Prophylactic Antibiotics: The patient does not                         require prophylactic antibiotics. Prior                         Anticoagulants: The patient has taken no anticoagulant                          or antiplatelet agents. ASA Grade Assessment: II - A                         patient with mild systemic disease. After reviewing                         the risks and benefits, the patient was deemed in                         satisfactory condition to undergo the procedure. The                         anesthesia plan was to use monitored anesthesia care                         (MAC). Immediately prior to administration of                         medications, the patient was re-assessed for adequacy                         to receive sedatives. The heart rate, respiratory                         rate, oxygen saturations, blood pressure, adequacy of                         pulmonary ventilation, and response to care were                         monitored throughout the procedure. The physical                         status of the patient was re-assessed after the                         procedure.                        After obtaining informed consent, the endoscope was                         passed under direct vision. Throughout the procedure,                         the patient's blood pressure, pulse, and oxygen                         saturations were monitored continuously. The Endoscope  was introduced through the mouth, and advanced to the                         second part of duodenum. The upper GI endoscopy was                         accomplished without difficulty. The patient tolerated                         the procedure well. Findings:      submucosal duodenal lesion within the sweep of the duodenum. Estimated       blood loss: none.      The exam of the duodenum was otherwise normal.      Localized moderate inflammation characterized by erosions and erythema       was found in the gastric antrum. Biopsies were taken with a cold forceps       for Helicobacter pylori testing. Estimated blood loss was minimal.      The exam of the  stomach was otherwise normal.      The Z-line was regular. Estimated blood loss: none.      Esophagogastric landmarks were identified: the gastroesophageal junction       was found at 45 cm from the incisors.      LA Grade A (one or more mucosal breaks less than 5 mm, not extending       between tops of 2 mucosal folds) esophagitis with no bleeding was found.       Estimated blood loss: none.      Normal mucosa was found in the upper third of the esophagus and in the       middle third of the esophagus. The scope was withdrawn. Dilation was       performed with a Maloney dilator with no resistance at 48 Fr. The       dilation site was examined following endoscope reinsertion and showed       mild mucosal disruption. Estimated blood loss was minimal. Biopsies were       obtained from the proximal and distal esophagus with cold forceps for       histology of suspected eosinophilic esophagitis. Estimated blood loss       was minimal.      The exam of the esophagus was otherwise normal. Impression:            - Gastritis. Biopsied.                        - Z-line regular.                        - Esophagogastric landmarks identified.                        - LA Grade A reflux esophagitis with no bleeding.                        - Normal mucosa was found in the upper third of the                         esophagus and in the middle third of the esophagus.  Dilated.                        - Biopsies were taken with a cold forceps for                         evaluation of eosinophilic esophagitis. Recommendation:        - Patient has a contact number available for                         emergencies. The signs and symptoms of potential                         delayed complications were discussed with the patient.                         Return to normal activities tomorrow. Written                         discharge instructions were provided to the patient.                         - Discharge patient to home.                        - Soft diet.                        - Continue present medications.                        - Await pathology results.                        - Return to GI clinic as previously scheduled.                        - Recommend cross sectional imaging to evaluate                         duodenal area. May need endoscopic ultrasound.                        Repeat dilation as needed.                        - The findings and recommendations were discussed with                         the patient. Procedure Code(s):     --- Professional ---                        [phone], Esophagogastroduodenoscopy, flexible,                         transoral; with biopsy, single or multiple                        43450, Dilation of esophagus, by unguided sound or  bougie, single or multiple passes Diagnosis Code(s):     --- Professional ---                        K29.70, Gastritis, unspecified, without bleeding                        K21.00, Gastro-esophageal reflux disease with                         esophagitis, without bleeding CPT copyright 2022 American [employer]. All rights reserved. The codes documented in this report are preliminary and upon coder review may  be revised to meet current compliance requirements. Attending Participation:      I personally performed the entire procedure. Elfredia Nevins, DO Jaynie Collins DO, DO 07/24/2023 2:45:28 PM This report has been signed electronically. Number of Addenda: 0 Note Initiated On: 07/24/2023 2:16 PM Estimated Blood Loss:  Estimated blood loss was minimal.      [facility]

## 2023-07-24 NOTE — H&P (Signed)
Pre-Procedure H&P   Patient ID: Vincent Hicks is a 52 y.o. male.  Gastroenterology Provider: Jaynie Collins, DO  PCP: Carren Rang, PA-C  Date: 07/24/2023  HPI Vincent Hicks is a 52 y.o. male who presents today for Esophagogastroduodenoscopy for Grade D esophagitis .  Patient also had H. pylori gastritis for which she is treated.  He has not taken his Mounjaro for a week.  His reflux has been well-controlled on voquenza Dysphagia symptoms have also improved.  No nausea or vomiting   Past Medical History:  Diagnosis Date   Allergic genetic state    Chest pain    Diabetes mellitus without complication (HCC)    GERD (gastroesophageal reflux disease)    Hypercholesteremia    Hypertension    Obesity    Palpitations    a. seen by cardiology in [place] and apparently wore a monitor - unrevealing (2012).   Schatzki's ring    a. s/p dilatations in the past.   Sleep apnea     Past Surgical History:  Procedure Laterality Date   BIOPSY  05/09/2023   Procedure: BIOPSY;  Surgeon: Jaynie Collins, DO;  Location: [facility] ENDOSCOPY;  Service: Gastroenterology;;   COLONOSCOPY N/A 07/13/2021   Procedure: COLONOSCOPY;  Surgeon: Jaynie Collins, DO;  Location: [facility] ENDOSCOPY;  Service: Gastroenterology;  Laterality: N/A;   ESOPHAGOGASTRODUODENOSCOPY (EGD) WITH PROPOFOL N/A 05/09/2023   Procedure: ESOPHAGOGASTRODUODENOSCOPY (EGD) WITH PROPOFOL;  Surgeon: Jaynie Collins, DO;  Location: [facility] ENDOSCOPY;  Service: Gastroenterology;  Laterality: N/A;   LEFT HEART CATHETERIZATION WITH CORONARY ANGIOGRAM N/A 02/15/2013   Procedure: LEFT HEART CATHETERIZATION WITH CORONARY ANGIOGRAM;  Surgeon: Vesta Mixer, MD;  Location: [facility] CATH LAB;  Service: Cardiovascular;  Laterality: N/A;   ROTATOR CUFF REPAIR      Family History No h/o GI disease or malignancy  Review of Systems  Constitutional:  Negative for activity change, appetite change, chills, diaphoresis,  fatigue, fever and unexpected weight change.  HENT:  Negative for trouble swallowing and voice change.   Eyes:  Negative for pain.  Respiratory:  Negative for shortness of breath and wheezing.   Cardiovascular:  Negative for chest pain, palpitations and leg swelling.  Gastrointestinal:  Negative for abdominal distention, abdominal pain, anal bleeding, blood in stool, constipation, diarrhea, nausea and vomiting.  Musculoskeletal:  Negative for arthralgias and myalgias.  Skin:  Negative for color change and pallor.  Neurological:  Negative for dizziness, syncope and weakness.  Psychiatric/Behavioral:  Negative for confusion. The patient is not nervous/anxious.   All other systems reviewed and are negative.    Medications No current facility-administered medications on file prior to encounter.   Current Outpatient Medications on File Prior to Encounter  Medication Sig Dispense Refill   amLODipine (NORVASC) 10 MG tablet Take 5 mg by mouth daily.     hydrochlorothiazide (HYDRODIURIL) 12.5 MG tablet Take 12.5 mg by mouth daily.     lansoprazole (PREVACID) 30 MG capsule Take 30 mg by mouth daily at 12 noon.     lisinopril (PRINIVIL,ZESTRIL) 40 MG tablet Take 1 tablet (40 mg total) by mouth daily. 30 tablet 2   APPLE CIDER VINEGAR PO Take 1 tablet by mouth 2 (two) times daily.     buPROPion (WELLBUTRIN XL) 150 MG 24 hr tablet Take 2 tablets by mouth daily.     lovastatin (MEVACOR) 20 MG tablet Take 1 tablet (20 mg total) by mouth daily. 30 tablet 2   Multiple Vitamins-Minerals (MULTIVITAMIN & MINERAL PO)  Take 1 tablet by mouth daily.     tirzepatide [facility]) 5 MG/0.5ML Pen Inject 5 mg into the skin once a week.      Pertinent medications related to GI and procedure were reviewed by me with the patient prior to the procedure   Current Facility-Administered Medications:    0.9 %  sodium chloride infusion, , Intravenous, Continuous, Jaynie Collins, DO, Last Rate: 20 mL/hr at 07/24/23  1422, Continued from Pre-op at 07/24/23 1422  sodium chloride 20 mL/hr at 07/24/23 1422       No Known Allergies Allergies were reviewed by me prior to the procedure  Objective   Body mass index is 40.03 kg/m. Vitals:   07/24/23 1332  BP: (!) 113/91  Pulse: 70  Resp: 18  Temp: (!) 96.3 F (35.7 C)  TempSrc: Temporal  SpO2: 95%  Weight: 130.2 kg  Height: 5\' 11"  (1.803 m)     Physical Exam Vitals and nursing note reviewed.  Constitutional:      General: He is not in acute distress.    Appearance: Normal appearance. He is not ill-appearing, toxic-appearing or diaphoretic.  HENT:     Head: Normocephalic and atraumatic.     Nose: Nose normal.     Mouth/Throat:     Mouth: Mucous membranes are moist.     Pharynx: Oropharynx is clear.  Eyes:     General: No scleral icterus.    Extraocular Movements: Extraocular movements intact.  Cardiovascular:     Rate and Rhythm: Normal rate and regular rhythm.     Heart sounds: Normal heart sounds. No murmur heard.    No friction rub. No gallop.  Pulmonary:     Effort: Pulmonary effort is normal. No respiratory distress.     Breath sounds: Normal breath sounds. No wheezing, rhonchi or rales.  Abdominal:     General: Bowel sounds are normal. There is no distension.     Palpations: Abdomen is soft.     Tenderness: There is no abdominal tenderness. There is no guarding or rebound.  Musculoskeletal:     Cervical back: Neck supple.     Right lower leg: No edema.     Left lower leg: No edema.  Skin:    General: Skin is warm and dry.     Coloration: Skin is not jaundiced or pale.  Neurological:     General: No focal deficit present.     Mental Status: He is alert and oriented to person, place, and time. Mental status is at baseline.  Psychiatric:        Mood and Affect: Mood normal.        Behavior: Behavior normal.        Thought Content: Thought content normal.        Judgment: Judgment normal.      Assessment:  Vincent Hicks is a 52 y.o. male  who presents today for Esophagogastroduodenoscopy for Grade D esophagitis .  Plan:  Esophagogastroduodenoscopy with possible intervention today  Esophagogastroduodenoscopy with possible biopsy, control of bleeding, polypectomy, and interventions as necessary has been discussed with the patient/patient representative. Informed consent was obtained from the patient/patient representative after explaining the indication, nature, and risks of the procedure including but not limited to death, bleeding, perforation, missed neoplasm/lesions, cardiorespiratory compromise, and reaction to medications. Opportunity for questions was given and appropriate answers were provided. Patient/patient representative has verbalized understanding is amenable to undergoing the procedure.   Jaynie Collins, DO  [facility]  Gastroenterology  Portions of the record may have been created with voice recognition software. Occasional wrong-word or 'sound-a-like' substitutions may have occurred due to the inherent limitations of voice recognition software.  Read the chart carefully and recognize, using context, where substitutions may have occurred.

## 2023-07-24 NOTE — Anesthesia Preprocedure Evaluation (Addendum)
Anesthesia Evaluation  Patient identified by MRN, date of birth, ID band Patient awake    Reviewed: Allergy & Precautions, NPO status , Patient's Chart, lab work & pertinent test results  History of Anesthesia Complications Negative for: history of anesthetic complications  Airway Mallampati: III  TM Distance: >3 FB Neck ROM: full    Dental  (+) Chipped, Dental Advidsory Given   Pulmonary neg shortness of breath, sleep apnea and Continuous Positive Airway Pressure Ventilation , neg COPD, neg recent URI, former smoker   Pulmonary exam normal        Cardiovascular hypertension, (-) angina (-) Past MI and (-) Cardiac Stents Normal cardiovascular exam(-) dysrhythmias (-) Valvular Problems/Murmurs     Neuro/Psych negative neurological ROS  negative psych ROS   GI/Hepatic Neg liver ROS, PUD,GERD  ,,  Endo/Other  diabetes (borderline)  Class 3 obesity  Renal/GU      Musculoskeletal   Abdominal   Peds  Hematology negative hematology ROS (+)   Anesthesia Other Findings Past Medical History: No date: Allergic genetic state No date: Chest pain No date: Diabetes mellitus without complication (HCC) No date: GERD (gastroesophageal reflux disease) No date: Hypercholesteremia No date: Hypertension No date: Obesity No date: Palpitations     Comment:  a. seen by cardiology in [place] and apparently wore               a monitor - unrevealing (2012). No date: Schatzki's ring     Comment:  a. s/p dilatations in the past. No date: Sleep apnea  Past Surgical History: 07/13/2021: COLONOSCOPY; N/A     Comment:  Procedure: COLONOSCOPY;  Surgeon: Jaynie Collins,              DO;  Location: [facility] ENDOSCOPY;  Service:               Gastroenterology;  Laterality: N/A; 02/15/2013: LEFT HEART CATHETERIZATION WITH CORONARY ANGIOGRAM; N/A     Comment:  Procedure: LEFT HEART CATHETERIZATION WITH CORONARY               ANGIOGRAM;   Surgeon: Vesta Mixer, MD;  Location: [facility]               CATH LAB;  Service: Cardiovascular;  Laterality: N/A; No date: ROTATOR CUFF REPAIR     Reproductive/Obstetrics negative OB ROS                             Anesthesia Physical Anesthesia Plan  ASA: 2  Anesthesia Plan: General   Post-op Pain Management:    Induction: Intravenous  PONV Risk Score and Plan: TIVA and Propofol infusion  Airway Management Planned: Natural Airway and Nasal Cannula  Additional Equipment:   Intra-op Plan:   Post-operative Plan:   Informed Consent: I have reviewed the patients History and Physical, chart, labs and discussed the procedure including the risks, benefits and alternatives for the proposed anesthesia with the patient or authorized representative who has indicated his/her understanding and acceptance.     Dental Advisory Given  Plan Discussed with: Anesthesiologist, CRNA and Surgeon  Anesthesia Plan Comments: (Patient consented for risks of anesthesia including but not limited to:  - adverse reactions to medications - damage to eyes, teeth, lips or other oral mucosa - nerve damage due to positioning  - sore throat or hoarseness - Damage to heart, brain, nerves, lungs, other parts of body or loss of life  Patient voiced understanding.)  Anesthesia Quick Evaluation

## 2023-07-24 NOTE — Interval H&P Note (Signed)
History and Physical Interval Note: Preprocedure H&P from 07/24/23  was reviewed and there was no interval change after seeing and examining the patient.  Written consent was obtained from the patient after discussion of risks, benefits, and alternatives. Patient has consented to proceed with Esophagogastroduodenoscopy with possible intervention   07/24/2023 2:22 PM  Dola Argyle Dinapoli  has presented today for surgery, with the diagnosis of K29.70, B96.81 (ICD-10-CM) - Helicobacter pylori gastritis K20.80 (ICD-10-CM) - Esophagitis, [place] grade D.  The various methods of treatment have been discussed with the patient and family. After consideration of risks, benefits and other options for treatment, the patient has consented to  Procedure(s): ESOPHAGOGASTRODUODENOSCOPY (EGD) WITH PROPOFOL (N/A) as a surgical intervention.  The patient's history has been reviewed, patient examined, no change in status, stable for surgery.  I have reviewed the patient's chart and labs.  Questions were answered to the patient's satisfaction.     Jaynie Collins

## 2023-07-24 NOTE — Anesthesia Postprocedure Evaluation (Signed)
Anesthesia Post Note  Patient: Dola Argyle Elenbaas  Procedure(s) Performed: ESOPHAGOGASTRODUODENOSCOPY (EGD) WITH PROPOFOL BIOPSY  Patient location during evaluation: PACU Anesthesia Type: General Level of consciousness: awake and alert Pain management: pain level controlled Vital Signs Assessment: post-procedure vital signs reviewed and stable Respiratory status: spontaneous breathing, nonlabored ventilation, respiratory function stable and patient connected to nasal cannula oxygen Cardiovascular status: blood pressure returned to baseline and stable Postop Assessment: no apparent nausea or vomiting Anesthetic complications: no  No notable events documented.   Last Vitals:  Vitals:   07/24/23 1438 07/24/23 1448  BP: (!) 90/58 98/70  Pulse: 75 71  Resp: (!) 24 19  Temp: (!) 35.6 C   SpO2: 96% 97%    Last Pain:  Vitals:   07/24/23 1438  TempSrc: Temporal  PainSc:                  Stephanie Coup

## 2023-07-24 NOTE — Transfer of Care (Signed)
Immediate Anesthesia Transfer of Care Note  Patient: Dola Argyle Ziesmer  Procedure(s) Performed: ESOPHAGOGASTRODUODENOSCOPY (EGD) WITH PROPOFOL BIOPSY  Patient Location: PACU  Anesthesia Type:General  Level of Consciousness: drowsy  Airway & Oxygen Therapy: Patient Spontanous Breathing  Post-op Assessment: Report given to RN and Post -op Vital signs reviewed and stable  Post vital signs: Reviewed and stable  Last Vitals:  Vitals Value Taken Time  BP 90/58 07/24/23 1438  Temp 35.6 C 07/24/23 1438  Pulse 75 07/24/23 1438  Resp 24 07/24/23 1438  SpO2 96 % 07/24/23 1438    Last Pain:  Vitals:   07/24/23 1438  TempSrc: Temporal  PainSc:          Complications: No notable events documented.

## 2023-07-25 ENCOUNTER — Encounter: Payer: Self-pay | Admitting: Gastroenterology

## 2023-07-28 LAB — SURGICAL PATHOLOGY

## 2023-08-01 ENCOUNTER — Other Ambulatory Visit: Payer: Self-pay | Admitting: Gastroenterology

## 2023-08-01 DIAGNOSIS — R198 Other specified symptoms and signs involving the digestive system and abdomen: Secondary | ICD-10-CM

## 2023-08-08 ENCOUNTER — Ambulatory Visit
Admission: RE | Admit: 2023-08-08 | Discharge: 2023-08-08 | Disposition: A | Discharge status: Home / Self Care | Payer: Managed Care, Other (non HMO) | Source: Ambulatory Visit | Attending: Gastroenterology | Admitting: Gastroenterology

## 2023-08-08 DIAGNOSIS — R198 Other specified symptoms and signs involving the digestive system and abdomen: Secondary | ICD-10-CM

## 2023-08-08 MED ORDER — IOPAMIDOL (ISOVUE-370) INJECTION 76%
100.0000 mL | Freq: Once | INTRAVENOUS | Status: AC | PRN
  Administered 2023-08-08: 100 mL via INTRAVENOUS
